# Patient Record
Sex: Female | Born: 1950
Health system: Southern US, Community
[De-identification: ages and names within clinical notes are randomized; demographics above are authoritative.]

## PROBLEM LIST (undated history)

## (undated) HISTORY — PX: BLADDER SURGERY: SHX569

---

## 2000-11-10 ENCOUNTER — Other Ambulatory Visit: Admission: RE | Admit: 2000-11-10 | Discharge: 2000-11-10 | Payer: Self-pay | Admitting: Obstetrics and Gynecology

## 2001-11-14 ENCOUNTER — Other Ambulatory Visit: Admission: RE | Admit: 2001-11-14 | Discharge: 2001-11-14 | Payer: Self-pay | Admitting: Obstetrics and Gynecology

## 2002-12-11 ENCOUNTER — Other Ambulatory Visit: Admission: RE | Admit: 2002-12-11 | Discharge: 2002-12-11 | Payer: Self-pay | Admitting: Obstetrics and Gynecology

## 2003-06-22 ENCOUNTER — Encounter (INDEPENDENT_AMBULATORY_CARE_PROVIDER_SITE_OTHER): Payer: Self-pay | Admitting: *Deleted

## 2003-06-22 ENCOUNTER — Ambulatory Visit (HOSPITAL_COMMUNITY): Admission: RE | Admit: 2003-06-22 | Discharge: 2003-06-22 | Payer: Self-pay | Admitting: Obstetrics and Gynecology

## 2003-12-17 ENCOUNTER — Other Ambulatory Visit: Admission: RE | Admit: 2003-12-17 | Discharge: 2003-12-17 | Payer: Self-pay | Admitting: Obstetrics and Gynecology

## 2005-01-15 ENCOUNTER — Other Ambulatory Visit: Admission: RE | Admit: 2005-01-15 | Discharge: 2005-01-15 | Payer: Self-pay | Admitting: Obstetrics and Gynecology

## 2008-03-06 ENCOUNTER — Encounter: Admission: RE | Admit: 2008-03-06 | Discharge: 2008-03-06 | Payer: Self-pay | Admitting: Obstetrics and Gynecology

## 2008-10-07 ENCOUNTER — Emergency Department (HOSPITAL_COMMUNITY): Admission: EM | Admit: 2008-10-07 | Discharge: 2008-10-07 | Payer: Self-pay | Admitting: Emergency Medicine

## 2008-10-09 ENCOUNTER — Emergency Department (HOSPITAL_COMMUNITY): Admission: EM | Admit: 2008-10-09 | Discharge: 2008-10-09 | Payer: Self-pay | Admitting: Emergency Medicine

## 2009-02-06 ENCOUNTER — Encounter: Admission: RE | Admit: 2009-02-06 | Discharge: 2009-02-06 | Payer: Self-pay | Admitting: Obstetrics and Gynecology

## 2009-03-07 ENCOUNTER — Encounter (INDEPENDENT_AMBULATORY_CARE_PROVIDER_SITE_OTHER): Payer: Self-pay | Admitting: Obstetrics and Gynecology

## 2009-03-08 ENCOUNTER — Inpatient Hospital Stay (HOSPITAL_COMMUNITY): Admission: RE | Admit: 2009-03-08 | Discharge: 2009-03-09 | Payer: Self-pay | Admitting: Obstetrics and Gynecology

## 2010-11-30 LAB — CBC
HCT: 35.9 % — ABNORMAL LOW (ref 36.0–46.0)
HCT: 43.7 % (ref 36.0–46.0)
Hemoglobin: 12.3 g/dL (ref 12.0–15.0)
Hemoglobin: 15.4 g/dL — ABNORMAL HIGH (ref 12.0–15.0)
MCHC: 35.2 g/dL (ref 30.0–36.0)
MCV: 88.9 fL (ref 78.0–100.0)
RBC: 3.97 MIL/uL (ref 3.87–5.11)
RDW: 12.6 % (ref 11.5–15.5)

## 2010-11-30 LAB — BASIC METABOLIC PANEL
CO2: 28 mEq/L (ref 19–32)
Glucose, Bld: 93 mg/dL (ref 70–99)
Potassium: 4.1 mEq/L (ref 3.5–5.1)
Sodium: 140 mEq/L (ref 135–145)

## 2010-12-09 LAB — POCT I-STAT, CHEM 8
Calcium, Ion: 1.05 mmol/L — ABNORMAL LOW (ref 1.12–1.32)
Glucose, Bld: 132 mg/dL — ABNORMAL HIGH (ref 70–99)
HCT: 44 % (ref 36.0–46.0)
Hemoglobin: 15 g/dL (ref 12.0–15.0)
TCO2: 21 mmol/L (ref 0–100)

## 2011-01-06 NOTE — H&P (Signed)
NAMEMARGURETTE, BRENER               ACCOUNT NO.:  [id number]   MEDICAL RECORD NO.:  [id number]          PATIENT TYPE:  AMB   LOCATION:  SDC                           FACILITY:  [facility]   PHYSICIAN:  Guy Sandifer. Henderson Cloud, M.D. DATE OF BIRTH:  February 04, 1951   DATE OF ADMISSION:  03/07/2009  DATE OF DISCHARGE:                              HISTORY & PHYSICAL   CHIEF COMPLAINT:  Symptomatic pelvic relaxation.   HISTORY OF PRESENT ILLNESS:  This patient is a 60 year old married white  female G1, P1, who is postmenopausal.  She complains of lower abdominal  and pelvic pressure.  She has a ballooning-type protrusion at times  through the vaginal introitus.  She occasionally has to reduce this.   PHYSICAL EXAMINATION:  She has a pelvic relaxation.  Also, urodynamic  studies done with a pessary in place are consistent with genuine stress  urinary continence.  After discussion of options, she is being admitted  for laparoscopically-assisted vaginal hysterectomy with bilateral  salpingo-oophorectomy, anterior-posterior colporrhaphy with probable  grafts, and midurethral sling.  Potential risks and complications have  been discussed preoperatively.   PAST MEDICAL HISTORY:  Arthritis.   PAST SURGICAL HISTORY:  Hysteroscopy and D and C.   MEDICATIONS:  Prempro daily and Mobic p.r.n.   ALLERGIES:  No known drug allergies.   SOCIAL HISTORY:  The patient denies tobacco, alcohol, or drug abuse.   FAMILY HISTORY:  Negative.   REVIEW OF SYSTEMS:  NEURO:  Denies headache.  CARDIO:  Denies chest  pain.  PULMONARY:  Denies shortness of breath.   PHYSICAL EXAMINATION:  VITAL SIGNS:  Height 5 feet 6-1/2 inches, weight  155 pounds, and blood pressure 122/66.  HEENT:  Without thyromegaly.  LUNGS:  Clear to auscultation.  HEART:  Regular rate and rhythm.  BACK:  No CVA tenderness.  ABDOMEN:  Soft and nontender without masses.  PELVIC:  Both vagina and cervix without lesion.  There is upper vaginal  detachment  anteriorly, which presents at the vaginal introitus.  The  cervix is right behind that with a very mobile, upper size, normal  uterus.  Adnexa are nontender without masses.  RECTAL:  There is adequate rectal sphincter tone.  Rectovaginal septum  is quite thin.  EXTREMITIES:  Grossly within normal limits.  NEUROLOGICAL:  Grossly within normal limits.   ASSESSMENT:  1. Symptomatic pelvic relaxation.  2. Genuine stress urinary continence.   PLAN:  Laparoscopically-assisted vaginal hysterectomy, bilateral  salpingo-oophorectomy, anterior-posterior colporrhaphy, and midurethral  sling.      Guy Sandifer Henderson Cloud, M.D.  Electronically Signed     JET/MEDQ  D:  03/06/2009  T:  03/07/2009  Job:  [id number]

## 2011-01-06 NOTE — Op Note (Signed)
[name], [name]               ACCOUNT NO.:  [id number]   MEDICAL RECORD NO.:  [id number]          PATIENT TYPE:  OIB   LOCATION:  9315                          FACILITY:  [facility]   PHYSICIAN:  Guy Sandifer. Henderson Cloud, M.D. DATE OF BIRTH:  [birth year]   DATE OF PROCEDURE:  03/07/2009  DATE OF DISCHARGE:                               OPERATIVE REPORT   PREOPERATIVE DIAGNOSES:  1. Pelvic relaxation.  2. Stress urinary incontinence.   POSTOPERATIVE DIAGNOSES:  1. Pelvic relaxation.  2. Stress urinary incontinence.   PROCEDURE:  Laparoscopically-assisted vaginal hysterectomy with  bilateral salpingo-oophorectomy, anterior colporrhaphy, vaginal  colpopexy, insertion of mesh, posterior colporrhaphy, and cystoscopy.   SURGEON:  Guy Sandifer. Henderson Cloud, MD   ASSISTANT:  Duke Salvia. Marcelle Overlie, MD   ANESTHESIA:  General with endotracheal intubation.   SPECIMENS:  Uterus, bilateral tubes, and ovaries to Pathology.   ESTIMATED BLOOD LOSS:  100 mL.   INDICATIONS AND CONSENT:  This patient is a 60 year old, married, white  female, G1, P1 with symptomatic pelvic relaxation and stress  incontinence.  Details are dictated in the history and physical.  The  above procedures were discussed with the patient preoperatively.  Potential risks and complications have been discussed preoperatively  including, but not limited to infection, organ damage, bleeding  requiring transfusion of blood products with HIV and hepatitis  acquisition, DVT, PE, pneumonia, fistula formation, laparotomy, pelvic  pain, postoperative dyspareunia, erosion, delayed healing, recurrence of  pelvic relaxation to assess the vaginal dilators, success and failure  rates of the sling, postoperative catheterization, self-catheterization,  return to the operating room, irritative voiding symptoms, and vulvar  pain.  All questions were answered and consent is signed on the chart.   FINDINGS:  Upper abdomen is grossly normal.  Uterus is normal  in size  and contour.  Tubes are normal in appearance.  Anterior and posterior  cul-de-sacs normal.   PROCEDURE:  The patient was taken to the operating room where she is  identified, placed in dorsal supine position and general anesthesia was  induced via endotracheal intubation.  She was then placed in the dorsal  lithotomy position, where she is prepped abdominally and vaginally.  Bladder straight catheterized.  Hulka tenaculum was placed in the uterus  as a manipulator and she is draped in the sterile fashion.  Time-out was  then undertaken.  The infraumbilical and suprapubic areas were then  injected with 1.5% plain Marcaine.  Small infraumbilical incision was  made and a disposable Veress needle was placed on the first attempt  without difficulty.  A normal syringe and drop test are noted.  A 2 L of  gas were then insufflated under low pressure with good tympany in the  right upper quadrant.  Veress needle was removed and a 10-11 Xcel  bladeless with disposable trocar sleeve was placed using direct  visualization with the diagnostic laparoscope.  The operative  laparoscope was then used.  Small suprapubic incision is made in a 5-mm  Xcel bladeless disposable trocar sleeve was placed under direct  visualization without difficulty.  The above findings are noted.  Then  using the EnSeal bipolar cautery cutting device, the right  infundibulopelvic ligament is taken down, carried across the  mesosalpinx, across the round ligament down to the level of the  vesicouterine peritoneum.  Similar procedure is carried out on the left  as well.  This is clear of the course of the ureters bilaterally.  The  vesicouterine peritoneum was taken down cephalad laterally.  Good  hemostasis was maintained.  Suprapubic trocar sleeve was removed.  Instruments are removed.  Attention is turned to the vagina.  Posterior  cul-de-sacs entered sharply and the cervix was circumscribed with  unipolar cautery.   Mucosa is advanced sharply and bluntly.  Then using  the handheld gyrus bipolar cautery instrument, progressive bites were  taken of the bladder pillars, cardinal ligaments, uterine vessels  bilaterally.  Fundus was delivered posteriorly and the specimens  delivered as the pedicles were taken down.  The uterosacral ligaments  were then plicated the vaginal cuff bilaterally with 0 Monocryl sutures.  They are then plicated in the midline with a third suture.  Cuff was  closed with figure-of-eights of the same suture.  A transverse vaginal  incision is made.  The anterior vaginal mucosa approximately at the  halfway mark.  The base of the bladder is then dissected sharply and  bluntly down to the level of the cuff and then bilaterally to the  ischial spines.  Then using the Capio needle driver, the pole graft is  placed by passing the arms through the sacrospinous ligaments  bilaterally at least 1 fingerbreadth medial to the spine.  The arms were  then advanced.  A 0 Monocryl suture is used to anchor the superior  midline edge of the graft to just above the vaginal cuff.  After being  properly seated, the sheath was removed intact from the arms bilaterally  and excess arms were trimmed back.  The distal midline portion of the  graft was anchored in place as well as the 2 distal corners with the  same suture.  The graft is lying flat.  There is good support  anteriorly.  The vaginal mucosa was then closed in a running locking  fashion with 2-0 Monocryl suture.  Foley catheter was placed.  The  bladder was drained and the Foley was left in place.  The suburethral  vaginal mucosa was then infiltrated with 1.5% Xylocaine with 1:200,000  epinephrine. It should also be noted, the anterior mucosa was  infiltrated with the same thing prior to the anterior colporrhaphy.  A  small midline incision is made below the course of the urethra.  This  was opened with the sterile needle scissors to the level  of the  vesicouterine or the urogenital diaphragm bilaterally.  The Solyx  polypropylene mesh mid urethral sling was then placed first on the  patient left being careful to keep the center of the sling which had  been previously marked in the midline.  It is then passed on the patient  right as well.  After the proper tension is adjusted, the sling is  released.  The sling is lying flush to the suburethral tissues with no  kinks.  It is just in contact with the tissues.  The vaginal mucosa is  then closed with a running locking 2-0 Monocryl suture.  Foley catheter  is removed and 73 cystoscope is used.  Inspection of the bladder at 360  degree fashion reveals no evidence of perforation or foreign body.  Good  puff of  urine is noted from the ureters bilaterally.  Cystoscope was  removed.  Foley catheter is replaced.  The bladder was drained and the  Foley was left in place.  Posterior colporrhaphy was then done beginning  with the diamond shaped wedge of tissue removed from the perineal body.  The mucosa is infiltrated with the same Xylocaine epinephrine solution.  The mucosa is dissected from the underlying rectum in the midline  approximately 2/3rd way up the posterior mucosa.  It has been dissected  bilaterally, sharply, and bluntly.  The rectovaginal fascia was  reapproximated in the midline with interrupted 0 Monocryl sutures.  Small amount of vaginal mucosa is then trimmed from the edges and the  mucosa is then reapproximated in running locking fashion with 2-0  Monocryl suture down to the level of the perineal body.  Perineal body  is dissected down, reapproximated with interrupted 0 Monocryl pops and 2-  0 sutures continued on down a standard episiotomy type fashion.  A 1-  inch gauze with estrogen cream was then placed in the vagina.  Attention  was turned to the abdomen.  Pneumoperitoneum is reintroduced and the  suprapubic trocar sleeve was replaced under direct visualization.   Copious irrigation was carried out.  Small amount of bleeding at the  mucosal edges is controlled with bipolar cautery.  Inspection under  reduced pneumoperitoneum reveals continued hemostasis.  A 25 mL of 1.5%  Marcaine  was then instilled into the peritoneal cavity.  Pneumoperitoneum was  reduced and all instruments are removed.  Both skin incisions were  closed with interrupted 2-0 Vicryl suture.  Dermabond was placed.  All  counts were correct.  The patient is awakened, taken to recovery room in  stable condition.      Guy Sandifer Henderson Cloud, M.D.  Electronically Signed     JET/MEDQ  D:  03/07/2009  T:  03/08/2009  Job:  [id number]

## 2011-01-06 NOTE — Discharge Summary (Signed)
[name], [name]               ACCOUNT NO.:  [id number]   MEDICAL RECORD NO.:  [id number]          PATIENT TYPE:  OIB   LOCATION:  9315                          FACILITY:  [facility]   PHYSICIAN:  Guy Sandifer. Henderson Cloud, M.D. DATE OF BIRTH:  [birth year]   DATE OF ADMISSION:  03/07/2009  DATE OF DISCHARGE:  03/09/2009                               DISCHARGE SUMMARY   ADMITTING DIAGNOSES:  1. Pelvic relaxation.  2. Stress urinary incontinence.   DISCHARGE DIAGNOSES:  1. Pelvic relaxation.  2. Stress urinary incontinence.   PROCEDURE:  On March 07, 2009, laparoscopically assisted vaginal  hysterectomy and bilateral salpingo-oophorectomy, anterior colporrhaphy,  vaginal colpopexy, insertion of mesh, posterior colporrhaphy, and  cystoscopy.   REASON FOR ADMISSION:  This patient is a 60 year old married white  female G1, P1 with symptomatic pelvic relaxation and stress  incontinence.  Details are dictated in the history and physical.  She  was admitted for surgical management.   HOSPITAL COURSE:  She was admitted to the hospital and undergoes the  above procedure.  Estimated blood loss was 100 mL.  On the evening of  surgery, she has good pain relief, urine output, and stable vital signs.  On the first postoperative day, the vaginal pack and Foley catheter are  out.  She is passing flatus but has not yet voided.  Vital signs are  stable, she is afebrile.  Hemoglobin is 12.3.  On the day of discharge,  she is tolerating regular diet, voiding, passing flatus, and ambulating.  Vital signs are stable, she remains afebrile.  Pathology is pending.   CONDITION ON DISCHARGE:  Good.   DIET:  Regular as tolerated.   ACTIVITY:  No lifting, no operation of automobiles, and no vaginal  entry.  She is to call our office for problems including but not limited  to temperature of 101 degrees, persistent nausea, vomiting, increasing  pain, or heavy bleeding.   MEDICATIONS:  1. Percocet 5/325 mg #40, 1-2  p.o. q.6 h. p.r.n.  2. Ibuprofen 600 mg q.6 h. p.r.n.  3. Colace daily.   FOLLOWUP:  Followup is in the office in 2 weeks.      Guy Sandifer Henderson Cloud, M.D.  Electronically Signed     JET/MEDQ  D:  03/09/2009  T:  03/09/2009  Job:  [id number]

## 2011-01-09 NOTE — Op Note (Signed)
NAME:  [name], [name]                         ACCOUNT NO.:  [id number]   MEDICAL RECORD NO.:  [id number]                   PATIENT TYPE:  AMB   LOCATION:  SDC                                  FACILITY:  [facility]   PHYSICIAN:  Guy Sandifer. Arleta Creek, M.D.           DATE OF BIRTH:  August 10, 1951   DATE OF PROCEDURE:  06/22/2003  DATE OF DISCHARGE:                                 OPERATIVE REPORT   PREOPERATIVE DIAGNOSIS:  Postmenopausal bleeding.   POSTOPERATIVE DIAGNOSIS:  Postmenopausal bleeding.   PROCEDURE:  Hysteroscopy, dilatation and curettage.   SURGEON:  Guy Sandifer. Henderson Cloud, M.D.   ANESTHESIA:  1. MAC.  2. 1% Xylocaine paracervical block.   ESTIMATED BLOOD LOSS:  Less than 50 mL.   INTAKE AND OUTPUT OF SORBITOL DISTENDING MEDIUM:  70 mL deficit.   SPECIMENS:  Endometrial curettings.   INDICATIONS AND CONSENT:  This patient is a 60 year old married white  female, G1, P1, who has had postmenopausal bleeding.  Details dictated in  the history and physical.  Hysteroscopy, resectoscope, and dilatation and  curettage has been discussed with the patient.  The potential risks and  complications were discussed preoperatively, including but not limited to  infection, uterine perforation, bowel, bladder, or ureteral damage, bleeding  requiring transfusion of blood products with possible transfusion, HIV and  hepatitis acquisition, DVT, PE, pneumonia, laparoscopy, laparotomy, and  hysterectomy.  All questions answered, consent was signed on the chart.   FINDINGS:  No abnormal structures were noted within the endometrial cavity.   DESCRIPTION OF PROCEDURE:  The patient was taken to the operating room,  placed in the dorsal supine position, where sedation is given.  She is then  placed in the dorsal lithotomy position, where she is prepped, the bladder  is straight-catheterized, and draped in a sterile fashion.  Bivalve speculum  was placed in the vagina and the anterior cervical lip is  injected with 1%  Xylocaine and grasped with a single-tooth tenaculum.  Paracervical block is  then placed at the 2, 4, 5, 7, 8, and 10 o'clock positions with  approximately 20 mL total of 1% Xylocaine plain.  The cervix is gently  progressively dilated to a 31 Pratt dilator.  The diagnostic hysteroscope is  placed in the endocervical canal and advanced under direct visualization  using sorbitol distending medium.  The above findings are noted.  Hysteroscope is withdrawn and sharp curettage is carried out.  The  hysteroscope is then reintroduced and again  advanced under direct visualization.  The cavity is totally clean.  The  instruments are removed, good hemostasis is noted.  All counts correct.  The  patient is awakened and taken to the recovery room in stable condition.  The  patient did received preoperative antibiotics.  Guy Sandifer Arleta Creek, M.D.    JET/MEDQ  D:  06/22/2003  T:  06/22/2003  Job:  [id number]

## 2011-01-09 NOTE — H&P (Signed)
   NAME:  [name], [name]                           ACCOUNT NO.:  [id number]   MEDICAL RECORD NO.:  [id number]                   PATIENT TYPE:   LOCATION:                                       FACILITY:   PHYSICIAN:  Guy Sandifer. Arleta Creek, M.D.           DATE OF BIRTH:   DATE OF ADMISSION:  06/22/2003  DATE OF DISCHARGE:                                HISTORY & PHYSICAL   CHIEF COMPLAINT:  Postmenopausal bleeding.   HISTORY OF PRESENT ILLNESS:  This patient is a 60 year old white female,  gravida 1, para 1, who had her last normal menstrual period in 2002.  She  has been on Prempro since May of this year. She has had at least six days of  bleeding.  Ultrasound in my office revealed a uterus measuring 9.37 x 3.80 x  4.86 cm.  Sonohysterogram was consistent with a 4 mm possibly polypoid  process.  Options were discussed.  Hysteroscopy with resectoscope and D&C  were discussed.  Potential risks and complications have been discussed  preoperatively.  The patient discontinued Prempro one week preoperatively.   PAST MEDICAL HISTORY:  Negative.   PAST SURGICAL HISTORY:  Tubal ligation.   PAST OBSTETRICAL HISTORY:  Vaginal delivery x1.   SOCIAL HISTORY:  The patient denies tobacco, alcohol, or drug abuse.   MEDICATIONS:  Vitamins p.r.n.   ALLERGIES:  No known drug allergies.   FAMILY HISTORY:  Multiple gestations maternal grandfather.   REVIEW OF SYSTEMS:  NEUROLOGY: The patient denies headache.  PULMONARY:  Denies shortness of breath.  CARDIAC: Denies chest pain.  GASTROINTESTINAL:  Denies recent changes in bowel habits.  MUSCULOSKELETAL: Denies joint aches.   PHYSICAL EXAMINATION:  VITAL SIGNS: Height 5 feet 7-1/2 inches, weight 157  pounds, blood pressure 130/74.  HEENT:  Without thyromegaly.  LUNGS:  Clear to auscultation.  HEART:  Regular rate and rhythm.  BACK: Without CVA tenderness.  BREASTS: Without masses, retractions, or discharge.  ABDOMEN: Soft and nontender without  masses.  PELVIC: Vulva, vagina, and cervix without lesion.  Uterus is anteverted,  normal size, mobile, and nontender.  Adnexa nontender without masses.  NEUROLOGY:  EXTREMITIES: Grossly within normal limits.    ASSESSMENT:  Postmenopausal bleeding.   PLAN:  Hysteroscopy with resectoscope, dilatation and curettage.                                               Guy Sandifer Arleta Creek, M.D.    JET/MEDQ  D:  06/14/2003  T:  06/14/2003  Job:  [id number]

## 2011-01-17 ENCOUNTER — Emergency Department (HOSPITAL_BASED_OUTPATIENT_CLINIC_OR_DEPARTMENT_OTHER)
Admission: EM | Admit: 2011-01-17 | Discharge: 2011-01-17 | Disposition: A | Discharge status: Home / Self Care | Payer: BC Managed Care – PPO | Attending: Emergency Medicine | Admitting: Emergency Medicine

## 2011-01-17 ENCOUNTER — Emergency Department (INDEPENDENT_AMBULATORY_CARE_PROVIDER_SITE_OTHER): Payer: BC Managed Care – PPO

## 2011-01-17 DIAGNOSIS — W208XXA Other cause of strike by thrown, projected or falling object, initial encounter: Secondary | ICD-10-CM | POA: Insufficient documentation

## 2011-01-17 DIAGNOSIS — M79609 Pain in unspecified limb: Secondary | ICD-10-CM

## 2011-01-17 DIAGNOSIS — S61409A Unspecified open wound of unspecified hand, initial encounter: Secondary | ICD-10-CM | POA: Insufficient documentation

## 2011-01-17 DIAGNOSIS — S61209A Unspecified open wound of unspecified finger without damage to nail, initial encounter: Secondary | ICD-10-CM

## 2011-01-17 DIAGNOSIS — G8929 Other chronic pain: Secondary | ICD-10-CM | POA: Insufficient documentation

## 2014-04-20 ENCOUNTER — Institutional Professional Consult (permissible substitution): Payer: BC Managed Care – PPO | Admitting: Pulmonary Disease

## 2014-09-05 ENCOUNTER — Other Ambulatory Visit (HOSPITAL_BASED_OUTPATIENT_CLINIC_OR_DEPARTMENT_OTHER): Payer: Self-pay | Admitting: Family Medicine

## 2014-09-05 ENCOUNTER — Ambulatory Visit (HOSPITAL_BASED_OUTPATIENT_CLINIC_OR_DEPARTMENT_OTHER)
Admission: RE | Admit: 2014-09-05 | Discharge: 2014-09-05 | Disposition: A | Discharge status: Home / Self Care | Payer: BLUE CROSS/BLUE SHIELD | Source: Ambulatory Visit | Attending: Family Medicine | Admitting: Family Medicine

## 2014-09-05 DIAGNOSIS — R160 Hepatomegaly, not elsewhere classified: Secondary | ICD-10-CM | POA: Diagnosis not present

## 2014-09-05 DIAGNOSIS — R1011 Right upper quadrant pain: Secondary | ICD-10-CM

## 2014-09-05 LAB — CBC AND DIFFERENTIAL
HCT: 42 % (ref 36–46)
Hemoglobin: 13.7 g/dL (ref 12.0–16.0)
Platelets: 236 10*3/uL (ref 150–399)
WBC: 16.7 10^3/mL

## 2014-09-05 LAB — HEPATIC FUNCTION PANEL
ALT: 39 U/L — AB (ref 7–35)
AST: 31 U/L (ref 13–35)
Bilirubin, Total: 1.7 mg/dL

## 2014-09-05 LAB — BASIC METABOLIC PANEL
BUN: 10 mg/dL (ref 4–21)
Glucose: 112 mg/dL
POTASSIUM: 3.8 mmol/L (ref 3.4–5.3)
Sodium: 137 mmol/L (ref 137–147)

## 2014-09-10 ENCOUNTER — Other Ambulatory Visit (HOSPITAL_BASED_OUTPATIENT_CLINIC_OR_DEPARTMENT_OTHER): Payer: Self-pay | Admitting: Family Medicine

## 2014-09-10 DIAGNOSIS — R932 Abnormal findings on diagnostic imaging of liver and biliary tract: Secondary | ICD-10-CM

## 2014-09-10 DIAGNOSIS — M5489 Other dorsalgia: Secondary | ICD-10-CM

## 2014-09-10 DIAGNOSIS — R1011 Right upper quadrant pain: Secondary | ICD-10-CM

## 2014-09-10 DIAGNOSIS — M25511 Pain in right shoulder: Secondary | ICD-10-CM

## 2014-09-12 ENCOUNTER — Encounter (HOSPITAL_COMMUNITY): Payer: Self-pay | Admitting: *Deleted

## 2014-09-12 ENCOUNTER — Ambulatory Visit (HOSPITAL_BASED_OUTPATIENT_CLINIC_OR_DEPARTMENT_OTHER)
Admission: RE | Admit: 2014-09-12 | Discharge: 2014-09-12 | Disposition: A | Discharge status: Home / Self Care | Payer: BLUE CROSS/BLUE SHIELD | Source: Ambulatory Visit | Attending: Family Medicine | Admitting: Family Medicine

## 2014-09-12 ENCOUNTER — Inpatient Hospital Stay (HOSPITAL_COMMUNITY)
Admission: EM | Admit: 2014-09-12 | Discharge: 2014-09-17 | DRG: 871 | Disposition: A | Discharge status: Home Health | Payer: BLUE CROSS/BLUE SHIELD | Attending: Internal Medicine | Admitting: Internal Medicine

## 2014-09-12 DIAGNOSIS — A419 Sepsis, unspecified organism: Principal | ICD-10-CM | POA: Diagnosis present

## 2014-09-12 DIAGNOSIS — K750 Abscess of liver: Secondary | ICD-10-CM | POA: Diagnosis present

## 2014-09-12 DIAGNOSIS — M25511 Pain in right shoulder: Secondary | ICD-10-CM

## 2014-09-12 DIAGNOSIS — Z79899 Other long term (current) drug therapy: Secondary | ICD-10-CM | POA: Diagnosis not present

## 2014-09-12 DIAGNOSIS — K7689 Other specified diseases of liver: Secondary | ICD-10-CM | POA: Diagnosis not present

## 2014-09-12 DIAGNOSIS — M5489 Other dorsalgia: Secondary | ICD-10-CM

## 2014-09-12 DIAGNOSIS — R1011 Right upper quadrant pain: Secondary | ICD-10-CM

## 2014-09-12 DIAGNOSIS — L0291 Cutaneous abscess, unspecified: Secondary | ICD-10-CM

## 2014-09-12 DIAGNOSIS — D72829 Elevated white blood cell count, unspecified: Secondary | ICD-10-CM | POA: Diagnosis not present

## 2014-09-12 DIAGNOSIS — R109 Unspecified abdominal pain: Secondary | ICD-10-CM

## 2014-09-12 DIAGNOSIS — K769 Liver disease, unspecified: Secondary | ICD-10-CM

## 2014-09-12 DIAGNOSIS — K5710 Diverticulosis of small intestine without perforation or abscess without bleeding: Secondary | ICD-10-CM | POA: Insufficient documentation

## 2014-09-12 DIAGNOSIS — R7989 Other specified abnormal findings of blood chemistry: Secondary | ICD-10-CM | POA: Diagnosis present

## 2014-09-12 DIAGNOSIS — IMO0002 Reserved for concepts with insufficient information to code with codable children: Secondary | ICD-10-CM

## 2014-09-12 DIAGNOSIS — R509 Fever, unspecified: Secondary | ICD-10-CM | POA: Diagnosis not present

## 2014-09-12 DIAGNOSIS — R932 Abnormal findings on diagnostic imaging of liver and biliary tract: Secondary | ICD-10-CM | POA: Insufficient documentation

## 2014-09-12 LAB — CBC WITH DIFFERENTIAL/PLATELET
Basophils Absolute: 0 10*3/uL (ref 0.0–0.1)
Basophils Relative: 0 % (ref 0–1)
Eosinophils Absolute: 0.1 10*3/uL (ref 0.0–0.7)
Eosinophils Relative: 0 % (ref 0–5)
HCT: 35.5 % — ABNORMAL LOW (ref 36.0–46.0)
Hemoglobin: 12.3 g/dL (ref 12.0–15.0)
Lymphocytes Relative: 10 % — ABNORMAL LOW (ref 12–46)
Lymphs Abs: 2.1 10*3/uL (ref 0.7–4.0)
MCH: 30.2 pg (ref 26.0–34.0)
MCHC: 34.6 g/dL (ref 30.0–36.0)
MCV: 87.2 fL (ref 78.0–100.0)
Monocytes Absolute: 1.7 10*3/uL — ABNORMAL HIGH (ref 0.1–1.0)
Monocytes Relative: 8 % (ref 3–12)
Neutro Abs: 16.7 10*3/uL — ABNORMAL HIGH (ref 1.7–7.7)
Neutrophils Relative %: 82 % — ABNORMAL HIGH (ref 43–77)
Platelets: 395 10*3/uL (ref 150–400)
RBC: 4.07 MIL/uL (ref 3.87–5.11)
RDW: 12.5 % (ref 11.5–15.5)
WBC: 20.5 10*3/uL — ABNORMAL HIGH (ref 4.0–10.5)

## 2014-09-12 LAB — COMPREHENSIVE METABOLIC PANEL
ALT: 49 U/L — ABNORMAL HIGH (ref 0–35)
AST: 57 U/L — ABNORMAL HIGH (ref 0–37)
Albumin: 2.7 g/dL — ABNORMAL LOW (ref 3.5–5.2)
Alkaline Phosphatase: 185 U/L — ABNORMAL HIGH (ref 39–117)
Anion gap: 7 (ref 5–15)
BUN: 5 mg/dL — ABNORMAL LOW (ref 6–23)
CO2: 30 mmol/L (ref 19–32)
Calcium: 8.2 mg/dL — ABNORMAL LOW (ref 8.4–10.5)
Chloride: 94 mEq/L — ABNORMAL LOW (ref 96–112)
Creatinine, Ser: 0.78 mg/dL (ref 0.50–1.10)
GFR calc Af Amer: >90 mL/min (ref >90)
GFR calc non Af Amer: 87 mL/min — ABNORMAL LOW (ref >90)
Glucose, Bld: 138 mg/dL — ABNORMAL HIGH (ref 70–99)
Potassium: 3.5 mmol/L (ref 3.5–5.1)
Sodium: 131 mmol/L — ABNORMAL LOW (ref 135–145)
Total Bilirubin: 1 mg/dL (ref 0.3–1.2)
Total Protein: 6.8 g/dL (ref 6.0–8.3)

## 2014-09-12 LAB — I-STAT CG4 LACTIC ACID, ED: Lactic Acid, Venous: 0.61 mmol/L (ref 0.5–2.2)

## 2014-09-12 MED ORDER — GADOBENATE DIMEGLUMINE 529 MG/ML IV SOLN: 15.0000 mL | Freq: Once | INTRAVENOUS | Status: AC | PRN

## 2014-09-12 MED ORDER — SODIUM CHLORIDE 0.9 % IV BOLUS (SEPSIS)
1000.0000 mL | Freq: Once | INTRAVENOUS | Status: AC
  Administered 2014-09-12: 1000 mL via INTRAVENOUS

## 2014-09-12 MED ORDER — MORPHINE SULFATE 4 MG/ML IJ SOLN
4.0000 mg | Freq: Once | INTRAMUSCULAR | Status: AC
  Administered 2014-09-12: 4 mg via INTRAVENOUS
  Filled 2014-09-12: qty 1

## 2014-09-12 MED ORDER — PIPERACILLIN-TAZOBACTAM 3.375 G IVPB 30 MIN
3.3750 g | Freq: Once | INTRAVENOUS | Status: AC
  Administered 2014-09-12: 3.375 g via INTRAVENOUS
  Filled 2014-09-12: qty 50

## 2014-09-12 MED ORDER — ONDANSETRON HCL 4 MG/2ML IJ SOLN
4.0000 mg | Freq: Once | INTRAMUSCULAR | Status: AC
  Administered 2014-09-12: 4 mg via INTRAVENOUS
  Filled 2014-09-12: qty 2

## 2014-09-12 MED ORDER — IBUPROFEN 400 MG PO TABS
600.0000 mg | ORAL_TABLET | Freq: Once | ORAL | Status: AC
  Administered 2014-09-12: 600 mg via ORAL
  Filled 2014-09-12 (×2): qty 1

## 2014-09-12 MED ORDER — SODIUM CHLORIDE 0.9 % IV SOLN
Freq: Once | INTRAVENOUS | Status: AC
  Administered 2014-09-12: via INTRAVENOUS

## 2014-09-12 NOTE — ED Provider Notes (Signed)
Medical screening examination/treatment/procedure(s) were conducted as a shared visit with non-physician practitioner(s) and myself.  I personally evaluated the patient during the encounter.   EKG Interpretation None      Pt is a 65 y.o. F with no significant past medical history presents emergency department with 2 weeks of right upper quadrant pain that radiates into her right shoulder. On 09/05/14 she had an ultrasound that showed multiple hypo echoic lesions. She had an MRI of her abdomen today which showed multiple hepatic lesions concerning for malignancy versus abscesses. She is febrile in the emergency department and has a leukocytosis with left shift. Fever infection. We'll give Zosyn. Will admit.  [place], DO 09/12/14 [id number]

## 2014-09-12 NOTE — ED Provider Notes (Signed)
CSN: [id number]     Arrival date & time 09/12/14  2152 History   First MD Initiated Contact with Patient 09/12/14 2210     Chief Complaint  Patient presents with  . Abdominal Pain   [name] is a 64 y.o. female who presents the emergency department complaining of abdominal pain for the past 2 weeks. Patient had a MRI of her abdomen done earlier today as an outpatient which indicated multiple peripherally enhancing hepatic lesions. Patient's primary care provider provided her with tramadol for pain however she reports this has not been working. Patient reports taking tramadol 50 mg at 845 tonight with minimal relief. Patient currently reports she is having right upper quadrant abdominal pain at 6 out of 10. She reports also having right shoulder pain for the past 2 weeks associated with her abdominal pain. Patient also reports decreased appetite. The patient's previous abdominal surgical history includes a total hysterectomy. The patient denies alcohol use. Patient's last bowel movement was yesterday and was normal. The patient denies fevers, chills, nausea, vomiting, constipation, diarrhea, travel outside the country, dysuria, hematuria, or hematochezia.   (Consider location/radiation/quality/duration/timing/severity/associated sxs/prior Treatment) HPI  History reviewed. No pertinent past medical history. History reviewed. No pertinent past surgical history. No family history on file. History  Substance Use Topics  . Smoking status: Never Smoker   . Smokeless tobacco: Not on file  . Alcohol Use: No   OB History    No data available     Review of Systems  Constitutional: Positive for appetite change. Negative for fever and chills.  HENT: Negative for congestion and sore throat.   Eyes: Negative for visual disturbance.  Respiratory: Negative for cough, shortness of breath and wheezing.   Cardiovascular: Negative for chest pain and palpitations.  Gastrointestinal: Positive for  abdominal pain. Negative for nausea, vomiting, diarrhea and blood in stool.  Genitourinary: Negative for dysuria, frequency, hematuria, vaginal bleeding and difficulty urinating.  Musculoskeletal: Negative for back pain and neck pain.       Right shoulder pain  Skin: Negative for rash.  Neurological: Negative for syncope and headaches.      Allergies  Review of patient's allergies indicates no known allergies.  Home Medications   Prior to Admission medications   Medication Sig Start Date End Date Taking? Authorizing Provider  traMADol (ULTRAM) 50 MG tablet Take 50 mg by mouth every 6 (six) hours as needed for moderate pain.   Yes Historical Provider, MD   BP 112/63 mmHg  Pulse 93  Temp(Src) 101.9 F (38.8 C) (Rectal)  Resp 18  Ht 5\' 7"  (1.702 m)  Wt 158 lb (71.668 kg)  BMI 24.74 kg/m2  SpO2 97% Physical Exam  Constitutional: She appears well-developed and well-nourished. No distress.  Nontoxic appearing.  HENT:  Head: Normocephalic and atraumatic.  Mouth/Throat: Oropharynx is clear and moist.  Eyes: Conjunctivae are normal. Pupils are equal, round, and reactive to light. Right eye exhibits no discharge. Left eye exhibits no discharge. No scleral icterus.  Neck: Neck supple.  Cardiovascular: Normal rate, regular rhythm, normal heart sounds and intact distal pulses.  Exam reveals no gallop and no friction rub.   No murmur heard. HR 96  Pulmonary/Chest: Effort normal and breath sounds normal. No respiratory distress. She has no wheezes. She has no rales.  Abdominal: Soft. Bowel sounds are normal. She exhibits no distension and no mass. There is tenderness. There is no rebound and no guarding.  Patient has right upper quadrant tenderness to palpation. Abdomen  is soft. Bowel sounds are present. No McBurney's point tenderness. Negative psoas and obturator sign.  Musculoskeletal: She exhibits no edema.  Right shoulder is nontender to palpation. There is no deformity, ecchymosis,   or edema noted to her right shoulder  Lymphadenopathy:    She has no cervical adenopathy.  Neurological: She is alert. Coordination normal.  Skin: Skin is warm and dry. No rash noted. She is not diaphoretic. No erythema. No pallor.  Psychiatric: She has a normal mood and affect. Her behavior is normal.  Nursing note and vitals reviewed.   ED Course  Procedures (including critical care time) Labs Review Labs Reviewed  COMPREHENSIVE METABOLIC PANEL - Abnormal; Notable for the following:    Sodium 131 (*)    Chloride 94 (*)    Glucose, Bld 138 (*)    BUN 5 (*)    Calcium 8.2 (*)    Albumin 2.7 (*)    AST 57 (*)    ALT 49 (*)    Alkaline Phosphatase 185 (*)    GFR calc non Af Amer 87 (*)    All other components within normal limits  CBC WITH DIFFERENTIAL - Abnormal; Notable for the following:    WBC 20.5 (*)    HCT 35.5 (*)    Neutrophils Relative % 82 (*)    Neutro Abs 16.7 (*)    Lymphocytes Relative 10 (*)    Monocytes Absolute 1.7 (*)    All other components within normal limits  CULTURE, BLOOD (ROUTINE X 2)  CULTURE, BLOOD (ROUTINE X 2)  URINE CULTURE  URINALYSIS, ROUTINE W REFLEX MICROSCOPIC  ECHINOCOCCUS ANTIBODY, IGG  CBC  BASIC METABOLIC PANEL  I-STAT CG4 LACTIC ACID, ED    Imaging Review Mr Abdomen W Wo Contrast  09/12/2014   CLINICAL DATA:  Initial encounter for right upper abdominal and shoulder pain. Ultrasound demonstrating hypoechoic liver lesions. No primary malignancy history submitted.  EXAM: MRI ABDOMEN WITHOUT AND WITH CONTRAST  TECHNIQUE: Multiplanar multisequence MR imaging of the abdomen was performed both before and after the administration of intravenous contrast.  CONTRAST:  14 cc MultiHance  COMPARISON:  09/05/2014 abdominal ultrasound.  FINDINGS: Lower chest: Normal heart size without pericardial or pleural effusion.  Hepatobiliary: No evidence of cirrhosis. Multiple heterogeneous T2 hyperintense, peripherally enhancing lesions are identified  throughout both lobes of the liver. Peripheral enhancing septae identified within the majority of these lesions. Index lesions include the high right hepatic lobe 3.9 x 3.9 cm lesion on image 7 of series 3. Anterior segment left liver lobe subcapsular 4.5 x 3.0 cm lesion on image 9 of series 3. Posterior segment right lobe 3.3 x 2.1 cm lesion on image 19 of series 3.  Minimal intrahepatic biliary ductal dilatation. Normal appearance of the gallbladder. The common duct measures 9 mm, minimally dilated for patient age. No obstructive stone identified. There is a periampullary duodenal diverticulum which may cause a component of common duct dilatation.  Pancreas: Tiny cystic foci within the pancreatic body dorsally. Example image 18 of series 14. 2 foci of measure on the order of 3 mm and likely represent foci of side branch ductectasia. No acute pancreatitis or main branch duct dilatation. No dominant pancreatic mass.  Spleen: Normal  Adrenals/Urinary Tract: Normal adrenal glands. Tiny bilateral renal cysts.  Stomach/Bowel: Normal stomach and abdominal bowel loops.  Vascular/Lymphatic: Normal caliber of the aorta and branch vessels. No retroperitoneal or retrocrural adenopathy.  Other: No ascites.  Musculoskeletal: No acute osseous abnormality.  IMPRESSION: 1. Multiple  peripherally enhancing hepatic lesions. Favor metastatic disease from unknown primary (possibilities include colon, breast, thyroid). Multifocal abscesses (pyogenic or even echinococcal) could have a similar appearance. Correlate with infectious symptoms and history of primary malignancy. Consider tissue sampling. 2. No primary malignancy identified within the abdomen. 3. Minimal biliary ductal prominence. No obstructive mass or stone. There is a periampullary duodenal diverticulum which could be the cause. 4. Minimal side branch duct ectasia within the pancreatic body. Of doubtful clinical significance.   Electronically Signed   By: Abigail Miyamoto M.D.    On: 09/12/2014 12:18     EKG Interpretation None      Filed Vitals:   09/12/14 2156 09/12/14 2305 09/12/14 2315  BP: 151/79  112/63  Pulse: 121  93  Temp: 99.8 F (37.7 C) 101.9 F (38.8 C)   TempSrc: Oral Rectal   Resp: 18    Height: 5\' 7"  (1.702 m)    Weight: 158 lb (71.668 kg)    SpO2: 97%  97%     MDM   Meds given in ED:  Medications  0.9 %  sodium chloride infusion (not administered)  sodium chloride 0.9 % injection 3 mL (not administered)  morphine 2 MG/ML injection 2-4 mg (not administered)  ondansetron (ZOFRAN) injection 4 mg (not administered)  morphine 4 MG/ML injection 4 mg (4 mg Intravenous Given 09/12/14 2248)  ondansetron (ZOFRAN) injection 4 mg (4 mg Intravenous Given 09/12/14 2248)  sodium chloride 0.9 % bolus 1,000 mL (0 mLs Intravenous Stopped 09/12/14 2343)  ibuprofen (ADVIL,MOTRIN) tablet 600 mg (600 mg Oral Given 09/12/14 2323)  piperacillin-tazobactam (ZOSYN) IVPB 3.375 g (3.375 g Intravenous New Bag/Given 09/12/14 2343)  0.9 %  sodium chloride infusion ( Intravenous Stopped 09/13/14 0032)    New Prescriptions   No medications on file     Final diagnoses:  Liver abscess   This is a 64 year old female who presents to emergency department with 2 weeks of right upper quadrant abdominal pain and right shoulder pain. Patient had an MRI of her abdomen today that showed multiple liver lesions concerning for malignancy or abscess. Patient has a rectal temperature 101.9. The patient's white count is 20.5. She also has mildly elevated LFTs. Concern for liver abscess. Patient given Zosyn and ibuprofen 600 mg in the ED. Consulted for admission. Dr. Alcario Drought accepted the patient for admission. Patient is in agreement with plan for admission.  This patient was discussed with and evaluated by Dr. Leonides Schanz who agrees with assessment and plan.     Hanley Hays, PA-C 09/13/14 [phone]

## 2014-09-12 NOTE — ED Notes (Signed)
Dansie PA made aware of pt temp

## 2014-09-12 NOTE — ED Notes (Signed)
The pt has had abd pain since last Thursday.  She has had an ultra sound and earlier today she had a mri.  She was sent by her doctor because she was in pain and she cannot get any relief.  Crying in triage

## 2014-09-13 ENCOUNTER — Encounter (HOSPITAL_COMMUNITY): Payer: Self-pay | Admitting: General Practice

## 2014-09-13 ENCOUNTER — Inpatient Hospital Stay (HOSPITAL_COMMUNITY): Payer: BLUE CROSS/BLUE SHIELD

## 2014-09-13 DIAGNOSIS — IMO0002 Reserved for concepts with insufficient information to code with codable children: Secondary | ICD-10-CM | POA: Diagnosis present

## 2014-09-13 DIAGNOSIS — K769 Liver disease, unspecified: Secondary | ICD-10-CM | POA: Diagnosis present

## 2014-09-13 DIAGNOSIS — K7689 Other specified diseases of liver: Secondary | ICD-10-CM

## 2014-09-13 DIAGNOSIS — K750 Abscess of liver: Secondary | ICD-10-CM | POA: Diagnosis present

## 2014-09-13 DIAGNOSIS — A419 Sepsis, unspecified organism: Principal | ICD-10-CM

## 2014-09-13 LAB — URINALYSIS, ROUTINE W REFLEX MICROSCOPIC
Bilirubin Urine: NEGATIVE
GLUCOSE, UA: NEGATIVE mg/dL
HGB URINE DIPSTICK: NEGATIVE
KETONES UR: NEGATIVE mg/dL
LEUKOCYTES UA: NEGATIVE
Nitrite: NEGATIVE
PROTEIN: NEGATIVE mg/dL
Specific Gravity, Urine: 1.007 (ref 1.005–1.030)
Urobilinogen, UA: 1 mg/dL (ref 0.0–1.0)
pH: 6.5 (ref 5.0–8.0)

## 2014-09-13 LAB — CBC
HCT: 35.3 % — ABNORMAL LOW (ref 36.0–46.0)
Hemoglobin: 11.9 g/dL — ABNORMAL LOW (ref 12.0–15.0)
MCH: 29.3 pg (ref 26.0–34.0)
MCHC: 33.7 g/dL (ref 30.0–36.0)
MCV: 86.9 fL (ref 78.0–100.0)
PLATELETS: 333 10*3/uL (ref 150–400)
RBC: 4.06 MIL/uL (ref 3.87–5.11)
RDW: 12.6 % (ref 11.5–15.5)
WBC: 16.7 10*3/uL — ABNORMAL HIGH (ref 4.0–10.5)

## 2014-09-13 LAB — BASIC METABOLIC PANEL
Anion gap: 9 (ref 5–15)
BUN: <5 mg/dL — ABNORMAL LOW (ref 6–23)
CO2: 26 mmol/L (ref 19–32)
Calcium: 8 mg/dL — ABNORMAL LOW (ref 8.4–10.5)
Chloride: 104 mEq/L (ref 96–112)
Creatinine, Ser: 0.7 mg/dL (ref 0.50–1.10)
GFR calc non Af Amer: 90 mL/min — ABNORMAL LOW (ref >90)
GLUCOSE: 119 mg/dL — AB (ref 70–99)
Potassium: 3.5 mmol/L (ref 3.5–5.1)
Sodium: 139 mmol/L (ref 135–145)

## 2014-09-13 LAB — PROTIME-INR
INR: 1.48 (ref 0.00–1.49)
Prothrombin Time: 18.1 seconds — ABNORMAL HIGH (ref 11.6–15.2)

## 2014-09-13 LAB — APTT: APTT: 32 s (ref 24–37)

## 2014-09-13 MED ORDER — SODIUM CHLORIDE 0.9 % IJ SOLN
3.0000 mL | Freq: Two times a day (BID) | INTRAMUSCULAR | Status: DC
  Administered 2014-09-14: 3 mL via INTRAVENOUS

## 2014-09-13 MED ORDER — ACETAMINOPHEN 325 MG PO TABS: 650.0000 mg | ORAL_TABLET | Freq: Four times a day (QID) | ORAL | Status: DC | PRN

## 2014-09-13 MED ORDER — LIDOCAINE HCL 1 % IJ SOLN
INTRAMUSCULAR | Status: AC
  Filled 2014-09-13: qty 20

## 2014-09-13 MED ORDER — SODIUM CHLORIDE 0.9 % IV SOLN
INTRAVENOUS | Status: DC
  Administered 2014-09-13 (×3): via INTRAVENOUS
  Administered 2014-09-13: 1000 mL via INTRAVENOUS
  Administered 2014-09-14 – 2014-09-17 (×6): via INTRAVENOUS

## 2014-09-13 MED ORDER — PIPERACILLIN-TAZOBACTAM 3.375 G IVPB
3.3750 g | Freq: Three times a day (TID) | INTRAVENOUS | Status: DC
  Administered 2014-09-13 – 2014-09-17 (×13): 3.375 g via INTRAVENOUS
  Filled 2014-09-13 (×15): qty 50

## 2014-09-13 MED ORDER — FENTANYL CITRATE 0.05 MG/ML IJ SOLN
INTRAMUSCULAR | Status: AC | PRN
  Administered 2014-09-13: 50 ug via INTRAVENOUS

## 2014-09-13 MED ORDER — MIDAZOLAM HCL 2 MG/2ML IJ SOLN
INTRAMUSCULAR | Status: AC
  Filled 2014-09-13: qty 4

## 2014-09-13 MED ORDER — MIDAZOLAM HCL 2 MG/2ML IJ SOLN
INTRAMUSCULAR | Status: AC | PRN
  Administered 2014-09-13: 1 mg via INTRAVENOUS

## 2014-09-13 MED ORDER — MORPHINE SULFATE 2 MG/ML IJ SOLN
2.0000 mg | INTRAMUSCULAR | Status: DC | PRN
  Administered 2014-09-13: 2 mg via INTRAVENOUS
  Administered 2014-09-13: 4 mg via INTRAVENOUS
  Administered 2014-09-13: 2 mg via INTRAVENOUS
  Administered 2014-09-13: 4 mg via INTRAVENOUS
  Administered 2014-09-13: 2 mg via INTRAVENOUS
  Administered 2014-09-14 – 2014-09-15 (×8): 4 mg via INTRAVENOUS
  Administered 2014-09-15: 2 mg via INTRAVENOUS
  Administered 2014-09-15 – 2014-09-17 (×8): 4 mg via INTRAVENOUS
  Administered 2014-09-17: 2 mg via INTRAVENOUS
  Administered 2014-09-17 (×2): 4 mg via INTRAVENOUS
  Filled 2014-09-13: qty 2
  Filled 2014-09-13: qty 1
  Filled 2014-09-13 (×12): qty 2
  Filled 2014-09-13: qty 1
  Filled 2014-09-13: qty 2
  Filled 2014-09-13: qty 1
  Filled 2014-09-13 (×4): qty 2
  Filled 2014-09-13: qty 1
  Filled 2014-09-13 (×2): qty 2

## 2014-09-13 MED ORDER — ONDANSETRON HCL 4 MG/2ML IJ SOLN: 4.0000 mg | Freq: Four times a day (QID) | INTRAMUSCULAR | Status: DC | PRN

## 2014-09-13 MED ORDER — FENTANYL CITRATE 0.05 MG/ML IJ SOLN
INTRAMUSCULAR | Status: AC
  Filled 2014-09-13: qty 4

## 2014-09-13 NOTE — Progress Notes (Addendum)
64yo female c/o abdominal pain x1wk, MRI shows multiple hepatic lesions concerning for malignancy vs abscesses, to begin IV ABX.  Will start Zosyn 3.375g IV Q8H for CrCl ~70 ml/min and monitor CBC and Cx.  Wynona Neat, PharmD, BCPS 09/13/2014 12:14 AM   Pharmacy will sign off and adjust as needed. Thank you. Anette Guarneri, PharmD [phone]

## 2014-09-13 NOTE — Progress Notes (Signed)
Nutrition Brief Note  Patient identified on the Malnutrition Screening Tool (MST) Report  Wt Readings from Last 15 Encounters:  09/13/14 158 lb 15.2 oz (72.1 kg)    Body mass index is 24.89 kg/(m^2). Patient meets criteria for normal body weight based on current BMI.   Current diet order is regular. Pt reports no recent weight loss. She says that she will be able to eat on her own and denied the need for nutritional supplements at this time.  Labs and medications reviewed.   No nutrition interventions warranted at this time. If nutrition issues arise, please consult RD.   Laurette Schimke MS, RD, LDN

## 2014-09-13 NOTE — Consult Note (Signed)
Chief Complaint: Chief Complaint  Patient presents with  . Abdominal Pain    Referring Physician(s): THR  History of Present Illness: [name] is a 64 y.o. female   Admitted from ED with RUQ pain Abnormal [place] abd and MRI revealing liver lesions/abscesses Pt with high wbc and fever Request for aspiration for evaluation per [facility] Dr Barbie Banner has reviewed imaging and approves procedure I have sen and examined pt   History reviewed. No pertinent past medical history.  History reviewed. No pertinent past surgical history.  Allergies: Review of patient's allergies indicates no known allergies.  Medications: Prior to Admission medications   Medication Sig Start Date End Date Taking? Authorizing Provider  traMADol (ULTRAM) 50 MG tablet Take 50 mg by mouth every 6 (six) hours as needed for moderate pain.   Yes Historical Provider, MD    History reviewed. No pertinent family history.  History   Social History  . Marital Status: Married    Spouse Name: N/A    Number of Children: N/A  . Years of Education: N/A   Social History Main Topics  . Smoking status: Never Smoker   . Smokeless tobacco: None  . Alcohol Use: No  . Drug Use: None  . Sexual Activity: None   Other Topics Concern  . None   Social History Narrative     Review of Systems: A 12 point ROS discussed and pertinent positives are indicated in the HPI above.  All other systems are negative.  Review of Systems  Constitutional: Positive for fever, chills, activity change, appetite change and fatigue.  Respiratory: Negative for cough and shortness of breath.   Gastrointestinal: Positive for abdominal pain.  Genitourinary: Negative for difficulty urinating.  Neurological: Positive for weakness.  Psychiatric/Behavioral: Negative for behavioral problems and confusion.     Vital Signs: BP 110/59 mmHg  Pulse 83  Temp(Src) 97.3 F (36.3 C) (Oral)  Resp 18  Ht 5\' 7"  (1.702 m)  Wt 72.1 kg (158 lb 15.2  oz)  BMI 24.89 kg/m2  SpO2 98%  Physical Exam  Constitutional: She is oriented to person, place, and time.  Cardiovascular: Normal rate and regular rhythm.   Pulmonary/Chest: Effort normal and breath sounds normal.  Abdominal: Soft. Bowel sounds are normal. There is tenderness.  Musculoskeletal: Normal range of motion.  Neurological: She is alert and oriented to person, place, and time.  Skin: Skin is warm and dry.  Psychiatric: She has a normal mood and affect. Her behavior is normal. Judgment and thought content normal.  Nursing note and vitals reviewed.   Imaging: Mr Abdomen W Wo Contrast  09/12/2014   CLINICAL DATA:  Initial encounter for right upper abdominal and shoulder pain. Ultrasound demonstrating hypoechoic liver lesions. No primary malignancy history submitted.  EXAM: MRI ABDOMEN WITHOUT AND WITH CONTRAST  TECHNIQUE: Multiplanar multisequence MR imaging of the abdomen was performed both before and after the administration of intravenous contrast.  CONTRAST:  14 cc MultiHance  COMPARISON:  09/05/2014 abdominal ultrasound.  FINDINGS: Lower chest: Normal heart size without pericardial or pleural effusion.  Hepatobiliary: No evidence of cirrhosis. Multiple heterogeneous T2 hyperintense, peripherally enhancing lesions are identified throughout both lobes of the liver. Peripheral enhancing septae identified within the majority of these lesions. Index lesions include the high right hepatic lobe 3.9 x 3.9 cm lesion on image 7 of series 3. Anterior segment left liver lobe subcapsular 4.5 x 3.0 cm lesion on image 9 of series 3. Posterior segment right lobe 3.3 x 2.1  cm lesion on image 19 of series 3.  Minimal intrahepatic biliary ductal dilatation. Normal appearance of the gallbladder. The common duct measures 9 mm, minimally dilated for patient age. No obstructive stone identified. There is a periampullary duodenal diverticulum which may cause a component of common duct dilatation.  Pancreas:  Tiny cystic foci within the pancreatic body dorsally. Example image 18 of series 14. 2 foci of measure on the order of 3 mm and likely represent foci of side branch ductectasia. No acute pancreatitis or main branch duct dilatation. No dominant pancreatic mass.  Spleen: Normal  Adrenals/Urinary Tract: Normal adrenal glands. Tiny bilateral renal cysts.  Stomach/Bowel: Normal stomach and abdominal bowel loops.  Vascular/Lymphatic: Normal caliber of the aorta and branch vessels. No retroperitoneal or retrocrural adenopathy.  Other: No ascites.  Musculoskeletal: No acute osseous abnormality.  IMPRESSION: 1. Multiple peripherally enhancing hepatic lesions. Favor metastatic disease from unknown primary (possibilities include colon, breast, thyroid). Multifocal abscesses (pyogenic or even echinococcal) could have a similar appearance. Correlate with infectious symptoms and history of primary malignancy. Consider tissue sampling. 2. No primary malignancy identified within the abdomen. 3. Minimal biliary ductal prominence. No obstructive mass or stone. There is a periampullary duodenal diverticulum which could be the cause. 4. Minimal side branch duct ectasia within the pancreatic body. Of doubtful clinical significance.   Electronically Signed   By: Abigail Miyamoto M.D.   On: 09/12/2014 12:18   US Abdomen Complete  09/05/2014   CLINICAL DATA:  Right upper quadrant pain with extension to the right scapula for the past 2 days  EXAM: ULTRASOUND ABDOMEN COMPLETE  COMPARISON:  None.  FINDINGS: Gallbladder: No gallstones or wall thickening visualized. No sonographic Murphy sign noted.  Common bile duct: Diameter: 6 mm  Liver: The hepatic echotexture is normal. There are numerous hypoechoic nodules demonstrated with the largest lying along the anterior surface of the right lobe measuring 2.7 x 2.2 x 3 cm. There are internal echoes but there is no increased vascularity. There is no intrahepatic ductal dilation.  IVC: Bowel gas  limits evaluation of the inferior vena cava.  Pancreas: Bowel gas limits evaluation of the pancreas.  Spleen: Size and appearance within normal limits.  Right Kidney: Length: 10.8 cm. Echogenicity within normal limits. No mass or hydronephrosis visualized.  Left Kidney: Length: 10.7 cm. Echogenicity within normal limits. No mass or hydronephrosis visualized.  Abdominal aorta: No aneurysm visualized.  Other findings: No ascites is demonstrated.  IMPRESSION: 1. There are abnormal hypoechoic masses within the liver of uncertain etiology. These could reflect metastatic or less likely primary malignancy. They do not reflect simple cysts. Hepatic protocol MRI is recommended. 2. There is no acute abnormality of the gallbladder, common bile duct, or visualized portions of the pancreas. There is no acute abnormality of the kidneys or spleen.   Electronically Signed   By: David  [place]   On: 09/05/2014 15:08    Labs:  CBC:  Recent Labs  09/12/14 2227 09/13/14 0655  WBC 20.5* 16.7*  HGB 12.3 11.9*  HCT 35.5* 35.3*  PLT 395 333    COAGS:  Recent Labs  09/13/14 0753  INR 1.48  APTT 32    BMP:  Recent Labs  09/12/14 2227 09/13/14 0655  NA 131* 139  K 3.5 3.5  CL 94* 104  CO2 30 26  GLUCOSE 138* 119*  BUN 5* <5*  CALCIUM 8.2* 8.0*  CREATININE 0.78 0.70  GFRNONAA 87* 90*  GFRAA >90 >90    LIVER FUNCTION  TESTS:  Recent Labs  09/12/14 2227  BILITOT 1.0  AST 57*  ALT 49*  ALKPHOS 185*  PROT 6.8  ALBUMIN 2.7*    TUMOR MARKERS: No results for input(s): AFPTM, CEA, CA199, CHROMGRNA in the last 8760 hours.  Assessment and Plan:  Liver abscesses Now for aspiration for evaluation Pt is aware of procedure benefits and risks and agreeable to proceed Consent signed andin chart  Thank you for this interesting consult.  I greatly enjoyed meeting [name] and look forward to participating in their care.    I spent a total of 40 minutes face to face in clinical  consultation, greater than 50% of which was counseling/coordinating care for liver abscess aspiration  Signed: Shakura Cowing A 09/13/2014, 9:28 AM

## 2014-09-13 NOTE — H&P (Signed)
[facility] History and Physical  [name] OIN:[id number] DOB: 05-23-1951 DOA: 09/12/2014  Referring physician: EDP PCP: Allena Katz, MD   Chief Complaint: RUQ abdominal pain   HPI: [name] is a 64 y.o. female previously healthy, who presents to the ED with 1 week history of RUQ abdominal pain.  Pain is 6/10 in intensity, ultrasound was done on 1/13 demonstrating hypoechoic masses of the liver.  Follow up liver MRI was done today as an outpatient which shows multiple peripherally enhancing hepatic lesions which represent either metastatic disease from an unknown primary (none was seen in the abdomen on that same MRI) vs multifocal abscesses which could have a similar appearance.  In the ED patient is running a fever of 101.9, is tachycardic to 120s, and has a WBC of 20k.  Review of Systems: Systems reviewed.  As above, otherwise negative  History reviewed. No pertinent past medical history. History reviewed. No pertinent past surgical history. Social History:  reports that she has never smoked. She does not have any smokeless tobacco history on file. She reports that she does not drink alcohol. Her drug history is not on file.  No Known Allergies  No family history on file.   Prior to Admission medications   Medication Sig Start Date End Date Taking? Authorizing Provider  traMADol (ULTRAM) 50 MG tablet Take 50 mg by mouth every 6 (six) hours as needed for moderate pain.   Yes Historical Provider, MD   Physical Exam: Filed Vitals:   09/12/14 2315  BP: 112/63  Pulse: 93  Temp:   Resp:     BP 112/63 mmHg  Pulse 93  Temp(Src) 101.9 F (38.8 C) (Rectal)  Resp 18  Ht 5\' 7"  (1.702 m)  Wt 71.668 kg (158 lb)  BMI 24.74 kg/m2  SpO2 97%  General Appearance:    Alert, oriented, no distress, appears stated age  Head:    Normocephalic, atraumatic  Eyes:    PERRL, EOMI, sclera non-icteric        Nose:   Nares without drainage or epistaxis. Mucosa,  turbinates normal  Throat:   Moist mucous membranes. Oropharynx without erythema or exudate.  Neck:   Supple. No carotid bruits.  No thyromegaly.  No lymphadenopathy.   Back:     No CVA tenderness, no spinal tenderness  Lungs:     Clear to auscultation bilaterally, without wheezes, rhonchi or rales  Chest wall:    No tenderness to palpitation  Heart:    Regular rate and rhythm without murmurs, gallops, rubs  Abdomen:     Soft, non-tender, nondistended, normal bowel sounds, no organomegaly  Genitalia:    deferred  Rectal:    deferred  Extremities:   No clubbing, cyanosis or edema.  Pulses:   2+ and symmetric all extremities  Skin:   Skin color, texture, turgor normal, no rashes or lesions  Lymph nodes:   Cervical, supraclavicular, and axillary nodes normal  Neurologic:   CNII-XII intact. Normal strength, sensation and reflexes      throughout    Labs on Admission:  Basic Metabolic Panel:  Recent Labs Lab 09/12/14 2227  NA 131*  K 3.5  CL 94*  CO2 30  GLUCOSE 138*  BUN 5*  CREATININE 0.78  CALCIUM 8.2*   Liver Function Tests:  Recent Labs Lab 09/12/14 2227  AST 57*  ALT 49*  ALKPHOS 185*  BILITOT 1.0  PROT 6.8  ALBUMIN 2.7*   No results for input(s): LIPASE, AMYLASE  in the last 168 hours. No results for input(s): AMMONIA in the last 168 hours. CBC:  Recent Labs Lab 09/12/14 2227  WBC 20.5*  NEUTROABS 16.7*  HGB 12.3  HCT 35.5*  MCV 87.2  PLT 395   Cardiac Enzymes: No results for input(s): CKTOTAL, CKMB, CKMBINDEX, TROPONINI in the last 168 hours.  BNP (last 3 results) No results for input(s): PROBNP in the last 8760 hours. CBG: No results for input(s): GLUCAP in the last 168 hours.  Radiological Exams on Admission: Mr Abdomen W Wo Contrast  09/12/2014   CLINICAL DATA:  Initial encounter for right upper abdominal and shoulder pain. Ultrasound demonstrating hypoechoic liver lesions. No primary malignancy history submitted.  EXAM: MRI ABDOMEN WITHOUT  AND WITH CONTRAST  TECHNIQUE: Multiplanar multisequence MR imaging of the abdomen was performed both before and after the administration of intravenous contrast.  CONTRAST:  14 cc MultiHance  COMPARISON:  09/05/2014 abdominal ultrasound.  FINDINGS: Lower chest: Normal heart size without pericardial or pleural effusion.  Hepatobiliary: No evidence of cirrhosis. Multiple heterogeneous T2 hyperintense, peripherally enhancing lesions are identified throughout both lobes of the liver. Peripheral enhancing septae identified within the majority of these lesions. Index lesions include the high right hepatic lobe 3.9 x 3.9 cm lesion on image 7 of series 3. Anterior segment left liver lobe subcapsular 4.5 x 3.0 cm lesion on image 9 of series 3. Posterior segment right lobe 3.3 x 2.1 cm lesion on image 19 of series 3.  Minimal intrahepatic biliary ductal dilatation. Normal appearance of the gallbladder. The common duct measures 9 mm, minimally dilated for patient age. No obstructive stone identified. There is a periampullary duodenal diverticulum which may cause a component of common duct dilatation.  Pancreas: Tiny cystic foci within the pancreatic body dorsally. Example image 18 of series 14. 2 foci of measure on the order of 3 mm and likely represent foci of side branch ductectasia. No acute pancreatitis or main branch duct dilatation. No dominant pancreatic mass.  Spleen: Normal  Adrenals/Urinary Tract: Normal adrenal glands. Tiny bilateral renal cysts.  Stomach/Bowel: Normal stomach and abdominal bowel loops.  Vascular/Lymphatic: Normal caliber of the aorta and branch vessels. No retroperitoneal or retrocrural adenopathy.  Other: No ascites.  Musculoskeletal: No acute osseous abnormality.  IMPRESSION: 1. Multiple peripherally enhancing hepatic lesions. Favor metastatic disease from unknown primary (possibilities include colon, breast, thyroid). Multifocal abscesses (pyogenic or even echinococcal) could have a similar  appearance. Correlate with infectious symptoms and history of primary malignancy. Consider tissue sampling. 2. No primary malignancy identified within the abdomen. 3. Minimal biliary ductal prominence. No obstructive mass or stone. There is a periampullary duodenal diverticulum which could be the cause. 4. Minimal side branch duct ectasia within the pancreatic body. Of doubtful clinical significance.   Electronically Signed   By: Abigail Miyamoto M.D.   On: 09/12/2014 12:18    EKG: Independently reviewed.  Assessment/Plan Principal Problem:   Liver abscess Active Problems:   Abdominal abscess   Lesion of liver   Sepsis   1. Multifocal liver abscesses vs metastatic disease to liver - these peripherally enhancing lesions are clinically acting in a manner consistent with abscesses.  Patient DOES have infectious symptoms (3/4 SIRS criteria with fever of 101.9, tachycardia of 121, and WBC of 20.5k) on evaluation today in the ED.  Echinococcus seems less likely as patient has no recent history of travel outside the country and does not consume undercooked meat. 1. BCx pending 2. Echinococcus IgG is pending 3. Zosyn  per pharm consult 4. Agree with radiologist that tissue sampling seems reasonable at this point given that the 2 most likely diagnoses are multifocal pyogenic liver abscesses vs metastatic malignancy to liver of unknown primary. 1. Spoke with radiologist on call, have put consult to IR in [facility], for evaluation and biopsy / drainage if they agree that it is appropriate. 2. Patient is NPO and on only SCDs for DVT ppx for possible biopsy / drainage 5. IVF: NS at 125 cc/hr 6. Tylenol PRN fever 7. Morphine PRN pain    Code Status: Full Code  Family Communication: Husband at bedside Disposition Plan: Admit to inpatient   Time spent: 70 min  GARDNER, JARED M. [facility] Pager [phone]  If 7AM-7PM, please contact the day team taking care of the patient [email or web address] Password  [facility] 09/13/2014, 12:25 AM

## 2014-09-13 NOTE — Procedures (Signed)
Liver abscess aspiration 15 cc pus No comp

## 2014-09-13 NOTE — Progress Notes (Signed)
Muse fired for patient when patient arrived to the floor- pt already on abx.

## 2014-09-13 NOTE — Progress Notes (Signed)
Utilization review completed. Jamica Woodyard, RN, BSN. 

## 2014-09-13 NOTE — ED Notes (Signed)
Admitting MD at bedside.

## 2014-09-13 NOTE — Sedation Documentation (Signed)
Bandaid to RU abdomen. Pt tolerated aspiration well

## 2014-09-13 NOTE — Progress Notes (Signed)
[facility] PROGRESS NOTE  [name] UEA:[id number] DOB: 03-Jul-1951 DOA: 09/12/2014 PCP: Nancy Katz, MD  Assessment/Plan:  SIRS -Presenting with fever, tachycardia, leukocytosis, source unclear but liver mass on CT questionable for abscess -Placed on IV Zosyn -blood/fungus cxs pending  Liver Mass - MRI liver- multiple enhancing hepatic lesions, representing metastatic disease or abscess -Radiologist on board- Aspiration 1/21 -Placed on IV Zosyn  -Echinococcus IgG pending -Blood cxs pening   DVT Prophylaxis SCDs Code Status: Full Family Communication: No family at bedside Disposition Plan: Inpatient   Consultants:  Radiology  Procedures:  Aspiration 09/13/14  Antibiotics:  Zosyn 1/21 >>  HPI [name] a 64 yo female, previously healthy, that presents to the ED with 2 week history of RUQ abdominal pain. Pain is associated with right shoulder pain, and reports decreased appetite.  She denies any fever, chills, N/V/D.  In ED, pt febrile, tachycardic, and with leukocytosis. Abd [place] with hypoechoic mass of liver. MRI liver with multiple enhancing hepatic lesions, representing metastatic disease or abscess. Patient is admitted for assessment.   Subjective -C/o of abdominal pain.  Denies any other complaints  Objective: Filed Vitals:   09/13/14 0548  BP: 110/59  Pulse:   Temp: 97.3 F (36.3 C)  Resp: 18    Intake/Output Summary (Last 24 hours) at 09/13/14 1331 Last data filed at 09/13/14 1017  Gross per 24 hour  Intake      0 ml  Output   1350 ml  Net  -1350 ml   Filed Weights   09/12/14 2156 09/13/14 0134  Weight: 71.668 kg (158 lb) 72.1 kg (158 lb 15.2 oz)    Exam:  Gen: Alert Caucasian female in NAD.  HEENT: Normocephalic, atraumatic.  Pupils symmertrical.  Moist mucosa.   Chest: clear to auscultate bilaterally, no ronchi or rales  Cardiac: Regular rate and rhythm, S1-S2, no rubs murmurs or gallops  Abdomen: soft, tenderness in  RUQ, non distended, +bowel sounds. No guarding or rigidity  Extremities: Symmetrical in appearance without cyanosis or edema  Neurological: Alert awake oriented to time place and person.    Data Reviewed: Basic Metabolic Panel:  Recent Labs Lab 09/12/14 2227 09/13/14 0655  NA 131* 139  K 3.5 3.5  CL 94* 104  CO2 30 26  GLUCOSE 138* 119*  BUN 5* <5*  CREATININE 0.78 0.70  CALCIUM 8.2* 8.0*   Liver Function Tests:  Recent Labs Lab 09/12/14 2227  AST 57*  ALT 49*  ALKPHOS 185*  BILITOT 1.0  PROT 6.8  ALBUMIN 2.7*   No results for input(s): LIPASE, AMYLASE in the last 168 hours. No results for input(s): AMMONIA in the last 168 hours. CBC:  Recent Labs Lab 09/12/14 2227 09/13/14 0655  WBC 20.5* 16.7*  NEUTROABS 16.7*  --   HGB 12.3 11.9*  HCT 35.5* 35.3*  MCV 87.2 86.9  PLT 395 333   Cardiac Enzymes: No results for input(s): CKTOTAL, CKMB, CKMBINDEX, TROPONINI in the last 168 hours. BNP (last 3 results) No results for input(s): PROBNP in the last 8760 hours. CBG: No results for input(s): GLUCAP in the last 168 hours.  No results found for this or any previous visit (from the past 240 hour(s)).   Studies: Mr Abdomen W Wo Contrast  09/12/2014   CLINICAL DATA:  Initial encounter for right upper abdominal and shoulder pain. Ultrasound demonstrating hypoechoic liver lesions. No primary malignancy history submitted.  EXAM: MRI ABDOMEN WITHOUT AND WITH CONTRAST  TECHNIQUE: Multiplanar multisequence MR imaging of  the abdomen was performed both before and after the administration of intravenous contrast.  CONTRAST:  14 cc MultiHance  COMPARISON:  09/05/2014 abdominal ultrasound.  FINDINGS: Lower chest: Normal heart size without pericardial or pleural effusion.  Hepatobiliary: No evidence of cirrhosis. Multiple heterogeneous T2 hyperintense, peripherally enhancing lesions are identified throughout both lobes of the liver. Peripheral enhancing septae identified within the  majority of these lesions. Index lesions include the high right hepatic lobe 3.9 x 3.9 cm lesion on image 7 of series 3. Anterior segment left liver lobe subcapsular 4.5 x 3.0 cm lesion on image 9 of series 3. Posterior segment right lobe 3.3 x 2.1 cm lesion on image 19 of series 3.  Minimal intrahepatic biliary ductal dilatation. Normal appearance of the gallbladder. The common duct measures 9 mm, minimally dilated for patient age. No obstructive stone identified. There is a periampullary duodenal diverticulum which may cause a component of common duct dilatation.  Pancreas: Tiny cystic foci within the pancreatic body dorsally. Example image 18 of series 14. 2 foci of measure on the order of 3 mm and likely represent foci of side branch ductectasia. No acute pancreatitis or main branch duct dilatation. No dominant pancreatic mass.  Spleen: Normal  Adrenals/Urinary Tract: Normal adrenal glands. Tiny bilateral renal cysts.  Stomach/Bowel: Normal stomach and abdominal bowel loops.  Vascular/Lymphatic: Normal caliber of the aorta and branch vessels. No retroperitoneal or retrocrural adenopathy.  Other: No ascites.  Musculoskeletal: No acute osseous abnormality.  IMPRESSION: 1. Multiple peripherally enhancing hepatic lesions. Favor metastatic disease from unknown primary (possibilities include colon, breast, thyroid). Multifocal abscesses (pyogenic or even echinococcal) could have a similar appearance. Correlate with infectious symptoms and history of primary malignancy. Consider tissue sampling. 2. No primary malignancy identified within the abdomen. 3. Minimal biliary ductal prominence. No obstructive mass or stone. There is a periampullary duodenal diverticulum which could be the cause. 4. Minimal side branch duct ectasia within the pancreatic body. Of doubtful clinical significance.   Electronically Signed   By: [name] M.D.   On: 09/12/2014 12:18    Scheduled Meds: . fentaNYL      . lidocaine      .  midazolam      . piperacillin-tazobactam (ZOSYN)  IV  3.375 g Intravenous Q8H  . sodium chloride  3 mL Intravenous Q12H   Continuous Infusions: . sodium chloride 125 mL/hr at 09/13/14 1194    Principal Problem:   Liver abscess Active Problems:   Abdominal abscess   Lesion of liver   Sepsis    Time spent: [place], [place] [facility]  [facility] Pager [phone]. If 7PM-7AM, please contact night-coverage at [email or web address], password [facility] 09/13/2014, 1:31 PM  LOS: 1 day

## 2014-09-13 NOTE — Progress Notes (Signed)
  Pt admitted to the unit. Pt is stable, alert and oriented per baseline. Oriented to room, staff, and call bell. Educated to call for any assistance. Bed in lowest position, call bell within reach- will continue to monitor. 

## 2014-09-14 DIAGNOSIS — M25511 Pain in right shoulder: Secondary | ICD-10-CM

## 2014-09-14 DIAGNOSIS — D72829 Elevated white blood cell count, unspecified: Secondary | ICD-10-CM

## 2014-09-14 DIAGNOSIS — R509 Fever, unspecified: Secondary | ICD-10-CM

## 2014-09-14 LAB — URINE CULTURE

## 2014-09-14 NOTE — Progress Notes (Signed)
[facility] PROGRESS NOTE  [name] GYJ:[id number] DOB: [birth year] DOA: 09/12/2014 PCP: Allena Katz, MD  Assessment/Plan:  SIRS -Presenting with fever, tachycardia, leukocytosis. -Source is what appears to be a liver abscess. -Placed on IV Zosyn -blood/fungus cxs pending -Sepsis parameters improved today.  Liver Abscess - S/p aspiration today. -Cx pending. -On Zosyn. -Requested ID consult with Dr. Baxter Flattery for further antibiotic recommendations and length of treatment.  DVT Prophylaxis SCDs Code Status: Full Family Communication: Patient only. Disposition Plan: Inpatient   Consultants:  IR  ID  Procedures:  Aspiration 09/13/14  Antibiotics:  Zosyn 1/21 >>  HPI [name] a 64 yo female, previously healthy, that presents to the ED with 2 week history of RUQ abdominal pain. Pain is associated with right shoulder pain, and reports decreased appetite.  She denies any fever, chills, N/V/D.  In ED, pt febrile, tachycardic, and with leukocytosis. Abd [place] with hypoechoic mass of liver. MRI liver with multiple enhancing hepatic lesions, representing metastatic disease or abscess. Patient is admitted for assessment. S/p aspiration of liver abscess on 1/22 yielding 15 cc of pus sent for cultures.   Subjective Feels much improved today. Some pain at the aspiration site.  Objective: Filed Vitals:   09/14/14 1316  BP: 120/53  Pulse: 88  Temp: 98.4 F (36.9 C)  Resp: 16    Intake/Output Summary (Last 24 hours) at 09/14/14 1639 Last data filed at 09/14/14 1622  Gross per 24 hour  Intake   2275 ml  Output   3550 ml  Net  -1275 ml   Filed Weights   09/12/14 2156 09/13/14 0134  Weight: 71.668 kg (158 lb) 72.1 kg (158 lb 15.2 oz)    Exam:  Gen: Alert Caucasian female in NAD.  HEENT: Normocephalic, atraumatic.  Pupils symmertrical.  Moist mucosa.   Chest: clear to auscultate bilaterally, no ronchi or rales  Cardiac: Regular rate and rhythm,  S1-S2, no rubs murmurs or gallops  Abdomen: soft, tenderness in RUQ, non distended, +bowel sounds. No guarding or rigidity  Extremities: Symmetrical in appearance without cyanosis or edema  Neurological: Alert awake oriented to time place and person.    Data Reviewed: Basic Metabolic Panel:  Recent Labs Lab 09/12/14 2227 09/13/14 0655  NA 131* 139  K 3.5 3.5  CL 94* 104  CO2 30 26  GLUCOSE 138* 119*  BUN 5* <5*  CREATININE 0.78 0.70  CALCIUM 8.2* 8.0*   Liver Function Tests:  Recent Labs Lab 09/12/14 2227  AST 57*  ALT 49*  ALKPHOS 185*  BILITOT 1.0  PROT 6.8  ALBUMIN 2.7*   No results for input(s): LIPASE, AMYLASE in the last 168 hours. No results for input(s): AMMONIA in the last 168 hours. CBC:  Recent Labs Lab 09/12/14 2227 09/13/14 0655  WBC 20.5* 16.7*  NEUTROABS 16.7*  --   HGB 12.3 11.9*  HCT 35.5* 35.3*  MCV 87.2 86.9  PLT 395 333   Cardiac Enzymes: No results for input(s): CKTOTAL, CKMB, CKMBINDEX, TROPONINI in the last 168 hours. BNP (last 3 results) No results for input(s): PROBNP in the last 8760 hours. CBG: No results for input(s): GLUCAP in the last 168 hours.  Recent Results (from the past 240 hour(s))  Blood culture (routine x 2)     Status: None (Preliminary result)   Collection Time: 09/12/14 10:48 PM  Result Value Ref Range Status   Specimen Description BLOOD RIGHT ARM  Final   Special Requests BOTTLES DRAWN AEROBIC AND ANAEROBIC 5CC  Final   Culture   Final           BLOOD CULTURE RECEIVED NO GROWTH TO DATE CULTURE WILL BE HELD FOR 5 DAYS BEFORE ISSUING A FINAL NEGATIVE REPORT Performed at [facility]    Report Status PENDING  Incomplete  Blood culture (routine x 2)     Status: None (Preliminary result)   Collection Time: 09/12/14 10:56 PM  Result Value Ref Range Status   Specimen Description BLOOD LEFT HAND  Final   Special Requests BOTTLES DRAWN AEROBIC AND ANAEROBIC 5CC  Final   Culture   Final           BLOOD  CULTURE RECEIVED NO GROWTH TO DATE CULTURE WILL BE HELD FOR 5 DAYS BEFORE ISSUING A FINAL NEGATIVE REPORT Performed at [facility]    Report Status PENDING  Incomplete  Urine culture     Status: None   Collection Time: 09/13/14 12:26 AM  Result Value Ref Range Status   Specimen Description URINE, RANDOM  Final   Special Requests NONE  Final   Colony Count   Final    25,000 COLONIES/ML Performed at [facility]    Culture   Final    Multiple bacterial morphotypes present, none predominant. Suggest appropriate recollection if clinically indicated. Performed at [facility]    Report Status 09/14/2014 FINAL  Final  Body fluid culture     Status: None (Preliminary result)   Collection Time: 09/13/14  2:40 PM  Result Value Ref Range Status   Specimen Description FLUID LIVER  Final   Special Requests NONE  Final   Gram Stain   Final    MODERATE WBC PRESENT,BOTH PMN AND MONONUCLEAR NO ORGANISMS SEEN Performed at [facility]    Culture NO GROWTH Performed at [facility]   Final   Report Status PENDING  Incomplete     Studies: Ct Image Guided Drainage By Percutaneous Catheter  09/13/2014   CLINICAL DATA:  Multiple liver fluid collections  EXAM: CT IMAGE GUIDED DRAINAGE BY PERCUTANEOUS CATHETER  FLUOROSCOPY TIME:  None  MEDICATIONS AND MEDICAL HISTORY: Versed 1 mg, Fentanyl 50 mcg.  Additional Medications: None.  ANESTHESIA/SEDATION: Moderate sedation time: 5 minutes  CONTRAST:  None  PROCEDURE: The procedure, risks, benefits, and alternatives were explained to the patient. Questions regarding the procedure were encouraged and answered. The patient understands and consents to the procedure.  The right upper quadrant was prepped with Betadine in a sterile fashion, and a sterile drape was applied covering the operative field. A sterile gown and sterile gloves were used for the procedure.  Under CT guidance, an 18 gauge needle was inserted into a  fluid collection in the right lobe of the liver. 15 cc of pus was aspirated. Final imaging was performed. A sample was sent for cytology and culture.  FINDINGS: Images document needle placement in a right lobe liver fluid collection. Post aspiration images demonstrate no hemorrhage.  COMPLICATIONS: None  IMPRESSION: Successful right lobe liver fluid collection aspiration for cytology and culture.   Electronically Signed   By: Maryclare Bean M.D.   On: 09/13/2014 17:04    Scheduled Meds: . piperacillin-tazobactam (ZOSYN)  IV  3.375 g Intravenous Q8H  . sodium chloride  3 mL Intravenous Q12H   Continuous Infusions: . sodium chloride 125 mL/hr at 09/14/14 1427    Principal Problem:   Liver abscess Active Problems:   Abdominal abscess   Lesion of liver   Sepsis  Time spent: 35 minutes    [facility] Pager [phone].  If 7PM-7AM, please contact night-coverage at [email or web address], password [facility] 09/14/2014, 4:39 PM  LOS: 2 days

## 2014-09-14 NOTE — Consult Note (Addendum)
[facility]  Total days of antibiotics 3        Day 3 piptazo               Reason for Consult: liver abscess    Referring Physician: hernandez  Principal Problem:   Liver abscess Active Problems:   Abdominal abscess   Lesion of liver   Sepsis    HPI: [name] is a 64 y.o. female with no significant past medical history started to have abdominal pain with referred right shoulder pain for roughly 2 wks, this coincided with loss of appetite. Denies fever, chills, nightsweat. No recent travel, no diarrheal illnesses, no dental work. She went to pcp for further evaluation. She had RUQ u/s on 1/13 that showed numerous hypoechogenic nodules, thus had   o/p MRI on 1/20 of her abdomen which indicated multiple peripherally enhancing hepatic lesions concerning for abscess rather than malignancy. Thus referred to the ED/admitted on 1/21 for further management.  She was found to have fever of 101.69F,tachycardia and leukocytosis of 20K with slight left shift. Blood cx were drawn in addition to IR doing aspiration on 1/22. She was empirically started on piptazo.  Allergies: No Known Allergies  Past med hx: S/p bladder surgery   MEDICATIONS: . piperacillin-tazobactam (ZOSYN)  IV  3.375 g Intravenous Q8H  . sodium chloride  3 mL Intravenous Q12H    History  Substance Use Topics  . Smoking status: Never Smoker   . Smokeless tobacco: Not on file  . Alcohol Use: No    History reviewed. No pertinent family history.  Review of Systems  Constitutional: positive for decreased appetite, 2lb weight loss. Negative for fever, chills, diaphoresis, activity change, , fatigue and unexpected weight change.  HENT: Negative for congestion, sore throat, rhinorrhea, sneezing, trouble swallowing and sinus pressure.  Eyes: Negative for photophobia and visual disturbance.  Respiratory: Negative for cough, chest tightness, shortness of breath, wheezing and stridor.    Cardiovascular: Negative for chest pain, palpitations and leg swelling.  Gastrointestinal: + right upper quadrant pain, and right shoulder pain. Negative for nausea, vomiting, abdominal pain, diarrhea, constipation, blood in stool, abdominal distention and anal bleeding.  Genitourinary: Negative for dysuria, hematuria, flank pain and difficulty urinating.  Musculoskeletal: Negative for myalgias, back pain, joint swelling, arthralgias and gait problem.  Skin: Negative for color change, pallor, rash and wound.  Neurological: Negative for dizziness, tremors, weakness and light-headedness.  Hematological: Negative for adenopathy. Does not bruise/bleed easily.  Psychiatric/Behavioral: Negative for behavioral problems, confusion, sleep disturbance, dysphoric mood, decreased concentration and agitation.     OBJECTIVE: Temp:  [98.4 F (36.9 C)-99.4 F (37.4 C)] 98.4 F (36.9 C) (01/22 1316) Pulse Rate:  [88-94] 88 (01/22 1316) Resp:  [15-16] 16 (01/22 1316) BP: (120-127)/(53-66) 120/53 mmHg (01/22 1316) SpO2:  [92 %-99 %] 99 % (01/22 1316) Constitutional:  oriented to person, place, and time. appears well-developed and well-nourished. No distress.  HENT:  Mouth/Throat: Oropharynx is clear and moist. No oropharyngeal exudate.  Cardiovascular: Normal rate, regular rhythm and normal heart sounds. Exam reveals no gallop and no friction rub.  No murmur heard.  Pulmonary/Chest: Effort normal and breath sounds normal. No respiratory distress.  has no wheezes.  Abdominal: Soft. Bowel sounds are normal.  exhibits no distension. Mild tenderness to ruq, and wincing when taking a deep breath Lymphadenopathy: no cervical adenopathy.  Neurological: alert and oriented to person, place, and time.  Skin: Skin is warm and dry. No rash  noted. No erythema.  Psychiatric: a normal mood and affect. behavior is normal.   LABS: Results for orders placed or performed during the hospital encounter of 09/12/14 (from  the past 48 hour(s))  Comprehensive metabolic panel     Status: Abnormal   Collection Time: 09/12/14 10:27 PM  Result Value Ref Range   Sodium 131 (L) 135 - 145 mmol/L    Comment: Please note change in reference range.   Potassium 3.5 3.5 - 5.1 mmol/L    Comment: Please note change in reference range.   Chloride 94 (L) 96 - 112 mEq/L   CO2 30 19 - 32 mmol/L   Glucose, Bld 138 (H) 70 - 99 mg/dL   BUN 5 (L) 6 - 23 mg/dL   Creatinine, Ser 0.78 0.50 - 1.10 mg/dL   Calcium 8.2 (L) 8.4 - 10.5 mg/dL   Total Protein 6.8 6.0 - 8.3 g/dL   Albumin 2.7 (L) 3.5 - 5.2 g/dL   AST 57 (H) 0 - 37 U/L   ALT 49 (H) 0 - 35 U/L   Alkaline Phosphatase 185 (H) 39 - 117 U/L   Total Bilirubin 1.0 0.3 - 1.2 mg/dL   GFR calc non Af Amer 87 (L) >90 mL/min   GFR calc Af Amer >90 >90 mL/min    Comment: (NOTE) The eGFR has been calculated using the CKD EPI equation. This calculation has not been validated in all clinical situations. eGFR's persistently <90 mL/min signify possible Chronic Kidney Disease.    Anion gap 7 5 - 15  CBC with Differential     Status: Abnormal   Collection Time: 09/12/14 10:27 PM  Result Value Ref Range   WBC 20.5 (H) 4.0 - 10.5 K/uL   RBC 4.07 3.87 - 5.11 MIL/uL   Hemoglobin 12.3 12.0 - 15.0 g/dL   HCT 35.5 (L) 36.0 - 46.0 %   MCV 87.2 78.0 - 100.0 fL   MCH 30.2 26.0 - 34.0 pg   MCHC 34.6 30.0 - 36.0 g/dL   RDW 12.5 11.5 - 15.5 %   Platelets 395 150 - 400 K/uL   Neutrophils Relative % 82 (H) 43 - 77 %   Neutro Abs 16.7 (H) 1.7 - 7.7 K/uL   Lymphocytes Relative 10 (L) 12 - 46 %   Lymphs Abs 2.1 0.7 - 4.0 K/uL   Monocytes Relative 8 3 - 12 %   Monocytes Absolute 1.7 (H) 0.1 - 1.0 K/uL   Eosinophils Relative 0 0 - 5 %   Eosinophils Absolute 0.1 0.0 - 0.7 K/uL   Basophils Relative 0 0 - 1 %   Basophils Absolute 0.0 0.0 - 0.1 K/uL  Blood culture (routine x 2)     Status: None (Preliminary result)   Collection Time: 09/12/14 10:48 PM  Result Value Ref Range   Specimen  Description BLOOD RIGHT ARM    Special Requests BOTTLES DRAWN AEROBIC AND ANAEROBIC 5CC    Culture             BLOOD CULTURE RECEIVED NO GROWTH TO DATE CULTURE WILL BE HELD FOR 5 DAYS BEFORE ISSUING A FINAL NEGATIVE REPORT Performed at [facility]    Report Status PENDING   Blood culture (routine x 2)     Status: None (Preliminary result)   Collection Time: 09/12/14 10:56 PM  Result Value Ref Range   Specimen Description BLOOD LEFT HAND    Special Requests BOTTLES DRAWN AEROBIC AND ANAEROBIC 5CC    Culture  BLOOD CULTURE RECEIVED NO GROWTH TO DATE CULTURE WILL BE HELD FOR 5 DAYS BEFORE ISSUING A FINAL NEGATIVE REPORT Performed at [facility]    Report Status PENDING   I-Stat CG4 Lactic Acid, ED     Status: None   Collection Time: 09/12/14 11:01 PM  Result Value Ref Range   Lactic Acid, Venous 0.61 0.5 - 2.2 mmol/L  Urinalysis, Routine w reflex microscopic     Status: None   Collection Time: 09/13/14 12:26 AM  Result Value Ref Range   Color, Urine YELLOW YELLOW   APPearance CLEAR CLEAR   Specific Gravity, Urine 1.007 1.005 - 1.030   pH 6.5 5.0 - 8.0   Glucose, UA NEGATIVE NEGATIVE mg/dL   Hgb urine dipstick NEGATIVE NEGATIVE   Bilirubin Urine NEGATIVE NEGATIVE   Ketones, ur NEGATIVE NEGATIVE mg/dL   Protein, ur NEGATIVE NEGATIVE mg/dL   Urobilinogen, UA 1.0 0.0 - 1.0 mg/dL   Nitrite NEGATIVE NEGATIVE   Leukocytes, UA NEGATIVE NEGATIVE    Comment: MICROSCOPIC NOT DONE ON URINES WITH NEGATIVE PROTEIN, BLOOD, LEUKOCYTES, NITRITE, OR GLUCOSE <1000 mg/dL.  Urine culture     Status: None   Collection Time: 09/13/14 12:26 AM  Result Value Ref Range   Specimen Description URINE, RANDOM    Special Requests NONE    Colony Count      25,000 COLONIES/ML Performed at [facility]    Culture      Multiple bacterial morphotypes present, none predominant. Suggest appropriate recollection if clinically indicated. Performed at [facility]      Report Status 09/14/2014 FINAL   CBC     Status: Abnormal   Collection Time: 09/13/14  6:55 AM  Result Value Ref Range   WBC 16.7 (H) 4.0 - 10.5 K/uL   RBC 4.06 3.87 - 5.11 MIL/uL   Hemoglobin 11.9 (L) 12.0 - 15.0 g/dL   HCT 35.3 (L) 36.0 - 46.0 %   MCV 86.9 78.0 - 100.0 fL   MCH 29.3 26.0 - 34.0 pg   MCHC 33.7 30.0 - 36.0 g/dL   RDW 12.6 11.5 - 15.5 %   Platelets 333 150 - 400 K/uL  Basic metabolic panel     Status: Abnormal   Collection Time: 09/13/14  6:55 AM  Result Value Ref Range   Sodium 139 135 - 145 mmol/L    Comment: Please note change in reference range.   Potassium 3.5 3.5 - 5.1 mmol/L    Comment: Please note change in reference range.   Chloride 104 96 - 112 mEq/L   CO2 26 19 - 32 mmol/L   Glucose, Bld 119 (H) 70 - 99 mg/dL   BUN <5 (L) 6 - 23 mg/dL    Comment: CONSISTENT WITH PREVIOUS RESULT   Creatinine, Ser 0.70 0.50 - 1.10 mg/dL   Calcium 8.0 (L) 8.4 - 10.5 mg/dL   GFR calc non Af Amer 90 (L) >90 mL/min   GFR calc Af Amer >90 >90 mL/min    Comment: (NOTE) The eGFR has been calculated using the CKD EPI equation. This calculation has not been validated in all clinical situations. eGFR's persistently <90 mL/min signify possible Chronic Kidney Disease.    Anion gap 9 5 - 15  APTT     Status: None   Collection Time: 09/13/14  7:53 AM  Result Value Ref Range   aPTT 32 24 - 37 seconds  Protime-INR     Status: Abnormal   Collection Time: 09/13/14  7:53 AM  Result Value Ref Range   Prothrombin Time 18.1 (H) 11.6 - 15.2 seconds   INR 1.48 0.00 - 1.49  Body fluid culture     Status: None (Preliminary result)   Collection Time: 09/13/14  2:40 PM  Result Value Ref Range   Specimen Description FLUID LIVER    Special Requests NONE    Gram Stain      MODERATE WBC PRESENT,BOTH PMN AND MONONUCLEAR NO ORGANISMS SEEN Performed at [facility]    Culture NO GROWTH Performed at [facility]     Report Status PENDING     MICRO: 1/21 blood cx  pending 1/22 liver aspirate cx pending  IMAGING: Ct Image Guided Drainage By Percutaneous Catheter  09/13/2014   CLINICAL DATA:  Multiple liver fluid collections  EXAM: CT IMAGE GUIDED DRAINAGE BY PERCUTANEOUS CATHETER  FLUOROSCOPY TIME:  None  MEDICATIONS AND MEDICAL HISTORY: Versed 1 mg, Fentanyl 50 mcg.  Additional Medications: None.  ANESTHESIA/SEDATION: Moderate sedation time: 5 minutes  CONTRAST:  None  PROCEDURE: The procedure, risks, benefits, and alternatives were explained to the patient. Questions regarding the procedure were encouraged and answered. The patient understands and consents to the procedure.  The right upper quadrant was prepped with Betadine in a sterile fashion, and a sterile drape was applied covering the operative field. A sterile gown and sterile gloves were used for the procedure.  Under CT guidance, an 18 gauge needle was inserted into a fluid collection in the right lobe of the liver. 15 cc of pus was aspirated. Final imaging was performed. A sample was sent for cytology and culture.  FINDINGS: Images document needle placement in a right lobe liver fluid collection. Post aspiration images demonstrate no hemorrhage.  COMPLICATIONS: None  IMPRESSION: Successful right lobe liver fluid collection aspiration for cytology and culture.   Electronically Signed   By: Maryclare Bean M.D.   On: 09/13/2014 17:04  abdominal MRI 1/20: Hepatobiliary: No evidence of cirrhosis. Multiple heterogeneous T2 hyperintense, peripherally enhancing lesions are identified throughout both lobes of the liver. Peripheral enhancing septae identified within the majority of these lesions. Index lesions include the high right hepatic lobe 3.9 x 3.9 cm lesion on image 7 of series 3. Anterior segment left liver lobe subcapsular 4.5 x 3.0 cm lesion on image 9 of series 3. Posterior segment right lobe 3.3 x 2.1 cm lesion on image 19 of series 3.  Ultrasound on 1/13: Liver: The hepatic echotexture is normal.  There are numerous hypoechoic nodules demonstrated with the largest lying along the anterior surface of the right lobe measuring 2.7 x 2.2 x 3 cm. There are internal echoes but there is no increased vascularity. There is no intrahepatic ductal dilation.  Assessment/Plan:  64yo F with 2-3 wk hx of abdominal pain found to have numerous enhancing lesions on abdominal mri concerning for hepatic abscess vs. Malignancy. She was admitted with leukocytosis and fever, thus favoring liver abscess.   - continue with piptazo until cx return to determine how we can narrow regimen - if blood cultures are positive, will recommend to get TTE.  - health maintenance, will check hep c and hiv ab.  Right shoulder pain = likely referred pain from liver abscesses, continue with prn schedule to determine if need to schedule meds  Leukocytosis = likely from infection, appears trending downward from antibiotics  Further recs to be provided by Dr. Johnnye Sima over the weekend.

## 2014-09-15 LAB — HEPATITIS C ANTIBODY: HCV Ab: NEGATIVE

## 2014-09-15 LAB — COMPREHENSIVE METABOLIC PANEL
ALK PHOS: 152 U/L — AB (ref 39–117)
ALT: 37 U/L — ABNORMAL HIGH (ref 0–35)
ANION GAP: 8 (ref 5–15)
AST: 35 U/L (ref 0–37)
Albumin: 2.2 g/dL — ABNORMAL LOW (ref 3.5–5.2)
BILIRUBIN TOTAL: 0.9 mg/dL (ref 0.3–1.2)
CALCIUM: 8 mg/dL — AB (ref 8.4–10.5)
CO2: 26 mmol/L (ref 19–32)
CREATININE: 0.67 mg/dL (ref 0.50–1.10)
Chloride: 104 mmol/L (ref 96–112)
GFR calc Af Amer: >90 mL/min (ref >90)
GFR calc non Af Amer: >90 mL/min (ref >90)
GLUCOSE: 130 mg/dL — AB (ref 70–99)
POTASSIUM: 3.4 mmol/L — AB (ref 3.5–5.1)
Sodium: 138 mmol/L (ref 135–145)
TOTAL PROTEIN: 5.7 g/dL — AB (ref 6.0–8.3)

## 2014-09-15 LAB — CBC
HCT: 32.6 % — ABNORMAL LOW (ref 36.0–46.0)
HEMOGLOBIN: 10.8 g/dL — AB (ref 12.0–15.0)
MCH: 29.2 pg (ref 26.0–34.0)
MCHC: 33.1 g/dL (ref 30.0–36.0)
MCV: 88.1 fL (ref 78.0–100.0)
PLATELETS: 368 10*3/uL (ref 150–400)
RBC: 3.7 MIL/uL — ABNORMAL LOW (ref 3.87–5.11)
RDW: 12.9 % (ref 11.5–15.5)
WBC: 18.1 10*3/uL — ABNORMAL HIGH (ref 4.0–10.5)

## 2014-09-15 MED ORDER — SODIUM CHLORIDE 0.9 % IJ SOLN
10.0000 mL | INTRAMUSCULAR | Status: DC | PRN
  Administered 2014-09-16 – 2014-09-17 (×3): 10 mL
  Filled 2014-09-15 (×3): qty 40

## 2014-09-15 MED ORDER — SODIUM CHLORIDE 0.9 % IJ SOLN
10.0000 mL | Freq: Two times a day (BID) | INTRAMUSCULAR | Status: DC
  Administered 2014-09-17: 10 mL

## 2014-09-15 NOTE — Progress Notes (Signed)
Peripherally Inserted Central Catheter/Midline Placement  The IV Nurse has discussed with the patient and/or persons authorized to consent for the patient, the purpose of this procedure and the potential benefits and risks involved with this procedure.  The benefits include less needle sticks, lab draws from the catheter and patient may be discharged home with the catheter.  Risks include, but not limited to, infection, bleeding, blood clot (thrombus formation), and puncture of an artery; nerve damage and irregular heat beat.  Alternatives to this procedure were also discussed.  PICC/Midline Placement Documentation  PICC / Midline Single Lumen 09/15/14 PICC Right Cephalic 36 cm 0 cm (Active)  Indication for Insertion or Continuance of Line Home intravenous therapies (PICC only) 09/15/2014  5:00 PM  Exposed Catheter (cm) 0 cm 09/15/2014  5:00 PM  Line Status Flushed;Saline locked;Blood return noted 09/15/2014  5:00 PM  Dressing Change Due 09/22/14 09/15/2014  5:00 PM       [facility] Payment 09/15/2014, 5:01 PM

## 2014-09-15 NOTE — Progress Notes (Addendum)
INFECTIOUS DISEASE PROGRESS NOTE  ID: [name] is a 64 y.o. female with  Principal Problem:   Liver abscess Active Problems:   Abdominal abscess   Lesion of liver   Sepsis  Subjective: Continued R shoulder pain  Abtx:  Anti-infectives    Start     Dose/Rate Route Frequency Ordered Stop   09/13/14 1100  piperacillin-tazobactam (ZOSYN) IVPB 3.375 g     3.375 g12.5 mL/hr over 240 Minutes Intravenous Every 8 hours 09/13/14 1030     09/12/14 2330  piperacillin-tazobactam (ZOSYN) IVPB 3.375 g     3.375 g100 mL/hr over 30 Minutes Intravenous  Once 09/12/14 2320 09/13/14 0045      Medications:  Scheduled: . piperacillin-tazobactam (ZOSYN)  IV  3.375 g Intravenous Q8H  . sodium chloride  3 mL Intravenous Q12H    Objective: Vital signs in last 24 hours: Temp:  [98.2 F (36.8 C)-98.7 F (37.1 C)] 98.2 F (36.8 C) (01/23 0548) Pulse Rate:  [86-100] 86 (01/23 0548) Resp:  [16-18] 16 (01/23 0548) BP: (120-133)/(53-62) 126/62 mmHg (01/23 0548) SpO2:  [93 %-99 %] 95 % (01/23 0548)   General appearance: alert, cooperative and no distress GI: normal findings: bowel sounds normal and soft, non-tender  Lab Results  Recent Labs  09/13/14 0655 09/15/14 0512  WBC 16.7* 18.1*  HGB 11.9* 10.8*  HCT 35.3* 32.6*  NA 139 138  K 3.5 3.4*  CL 104 104  CO2 26 26  BUN <5* <5*  CREATININE 0.70 0.67   Liver Panel  Recent Labs  09/12/14 2227 09/15/14 0512  PROT 6.8 5.7*  ALBUMIN 2.7* 2.2*  AST 57* 35  ALT 49* 37*  ALKPHOS 185* 152*  BILITOT 1.0 0.9   Sedimentation Rate No results for input(s): ESRSEDRATE in the last 72 hours. C-Reactive Protein No results for input(s): CRP in the last 72 hours.  Microbiology: Recent Results (from the past 240 hour(s))  Blood culture (routine x 2)     Status: None (Preliminary result)   Collection Time: 09/12/14 10:48 PM  Result Value Ref Range Status   Specimen Description BLOOD RIGHT ARM  Final   Special Requests BOTTLES  DRAWN AEROBIC AND ANAEROBIC 5CC  Final   Culture   Final           BLOOD CULTURE RECEIVED NO GROWTH TO DATE CULTURE WILL BE HELD FOR 5 DAYS BEFORE ISSUING A FINAL NEGATIVE REPORT Performed at [facility]    Report Status PENDING  Incomplete  Blood culture (routine x 2)     Status: None (Preliminary result)   Collection Time: 09/12/14 10:56 PM  Result Value Ref Range Status   Specimen Description BLOOD LEFT HAND  Final   Special Requests BOTTLES DRAWN AEROBIC AND ANAEROBIC 5CC  Final   Culture   Final           BLOOD CULTURE RECEIVED NO GROWTH TO DATE CULTURE WILL BE HELD FOR 5 DAYS BEFORE ISSUING A FINAL NEGATIVE REPORT Performed at [facility]    Report Status PENDING  Incomplete  Urine culture     Status: None   Collection Time: 09/13/14 12:26 AM  Result Value Ref Range Status   Specimen Description URINE, RANDOM  Final   Special Requests NONE  Final   Colony Count   Final    25,000 COLONIES/ML Performed at [facility]    Culture   Final    Multiple bacterial morphotypes present, none predominant. Suggest appropriate recollection if  clinically indicated. Performed at [facility]    Report Status 09/14/2014 FINAL  Final  Body fluid culture     Status: None (Preliminary result)   Collection Time: 09/13/14  2:40 PM  Result Value Ref Range Status   Specimen Description FLUID LIVER  Final   Special Requests NONE  Final   Gram Stain   Final    MODERATE WBC PRESENT,BOTH PMN AND MONONUCLEAR NO ORGANISMS SEEN Performed at [facility]    Culture   Final    NO GROWTH 2 DAYS Performed at [facility]    Report Status PENDING  Incomplete    Studies/Results: Ct Image Guided Drainage By Percutaneous Catheter  09/13/2014   CLINICAL DATA:  Multiple liver fluid collections  EXAM: CT IMAGE GUIDED DRAINAGE BY PERCUTANEOUS CATHETER  FLUOROSCOPY TIME:  None  MEDICATIONS AND MEDICAL HISTORY: Versed 1 mg, Fentanyl 50 mcg.  Additional  Medications: None.  ANESTHESIA/SEDATION: Moderate sedation time: 5 minutes  CONTRAST:  None  PROCEDURE: The procedure, risks, benefits, and alternatives were explained to the patient. Questions regarding the procedure were encouraged and answered. The patient understands and consents to the procedure.  The right upper quadrant was prepped with Betadine in a sterile fashion, and a sterile drape was applied covering the operative field. A sterile gown and sterile gloves were used for the procedure.  Under CT guidance, an 18 gauge needle was inserted into a fluid collection in the right lobe of the liver. 15 cc of pus was aspirated. Final imaging was performed. A sample was sent for cytology and culture.  FINDINGS: Images document needle placement in a right lobe liver fluid collection. Post aspiration images demonstrate no hemorrhage.  COMPLICATIONS: None  IMPRESSION: Successful right lobe liver fluid collection aspiration for cytology and culture.   Electronically Signed   By: Maryclare Bean M.D.   On: 09/13/2014 17:04     Assessment/Plan: Liver abscess (multiple)  Total days of antibiotics 3 (zosyn) Will continue current anbx Await Cx data HIV Ab pending Place [facility] If Cx (-) would consider home with 3 weeks of ertapenem, repeat CT scan at end of therapy, ID clinic appt, to determine length of therapy.          Bobby Rumpf Infectious Diseases (pager) [phone] [email or web address] 09/15/2014, 12:41 PM  LOS: 3 days

## 2014-09-15 NOTE — Progress Notes (Signed)
[facility] PROGRESS NOTE  [name] ZJI:[id number] DOB: 01-01-1951 DOA: 09/12/2014 PCP: Allena Katz, MD  Assessment/Plan:  SIRS -Presenting with fever, tachycardia, leukocytosis. -Source is what appears to be a liver abscess. -Placed on IV Zosyn -blood/fungus cxs pending. -Pathology negative for malignant cells. -Sepsis parameters improved today.  Liver Abscess - S/p aspiration 1/22. -Cx pending. -On Zosyn. -Appreciate ID input. If cx remain negative, DC on 3 weeks of ertapenem with ID follow up.  DVT Prophylaxis SCDs Code Status: Full Family Communication: Patient only. Disposition Plan: Inpatient   Consultants:  IR  ID  Procedures:  Aspiration 09/13/14  Antibiotics:  Zosyn 1/21 >>  HPI [name] a 64 yo female, previously healthy, that presents to the ED with 2 week history of RUQ abdominal pain. Pain is associated with right shoulder pain, and reports decreased appetite.  She denies any fever, chills, N/V/D.  In ED, pt febrile, tachycardic, and with leukocytosis. Abd [place] with hypoechoic mass of liver. MRI liver with multiple enhancing hepatic lesions, representing metastatic disease or abscess. Patient is admitted for assessment. S/p aspiration of liver abscess on 1/22 yielding 15 cc of pus sent for cultures.   Subjective Feels much improved today. Some pain at the aspiration site.  Objective: Filed Vitals:   09/15/14 1547  BP: 139/64  Pulse: 93  Temp: 99.2 F (37.3 C)  Resp: 20    Intake/Output Summary (Last 24 hours) at 09/15/14 1609 Last data filed at 09/15/14 1514  Gross per 24 hour  Intake    890 ml  Output   2900 ml  Net  -2010 ml   Filed Weights   09/12/14 2156 09/13/14 0134  Weight: 71.668 kg (158 lb) 72.1 kg (158 lb 15.2 oz)    Exam:  Gen: Alert Caucasian female in NAD.  HEENT: Normocephalic, atraumatic.  Pupils symmertrical.  Moist mucosa.   Chest: clear to auscultate bilaterally, no ronchi or rales  Cardiac:  Regular rate and rhythm, S1-S2, no rubs murmurs or gallops  Abdomen: soft, tenderness in RUQ, non distended, +bowel sounds. No guarding or rigidity  Extremities: Symmetrical in appearance without cyanosis or edema  Neurological: Alert awake oriented to time place and person.    Data Reviewed: Basic Metabolic Panel:  Recent Labs Lab 09/12/14 2227 09/13/14 0655 09/15/14 0512  NA 131* 139 138  K 3.5 3.5 3.4*  CL 94* 104 104  CO2 30 26 26   GLUCOSE 138* 119* 130*  BUN 5* <5* <5*  CREATININE 0.78 0.70 0.67  CALCIUM 8.2* 8.0* 8.0*   Liver Function Tests:  Recent Labs Lab 09/12/14 2227 09/15/14 0512  AST 57* 35  ALT 49* 37*  ALKPHOS 185* 152*  BILITOT 1.0 0.9  PROT 6.8 5.7*  ALBUMIN 2.7* 2.2*   No results for input(s): LIPASE, AMYLASE in the last 168 hours. No results for input(s): AMMONIA in the last 168 hours. CBC:  Recent Labs Lab 09/12/14 2227 09/13/14 0655 09/15/14 0512  WBC 20.5* 16.7* 18.1*  NEUTROABS 16.7*  --   --   HGB 12.3 11.9* 10.8*  HCT 35.5* 35.3* 32.6*  MCV 87.2 86.9 88.1  PLT 395 333 368   Cardiac Enzymes: No results for input(s): CKTOTAL, CKMB, CKMBINDEX, TROPONINI in the last 168 hours. BNP (last 3 results) No results for input(s): PROBNP in the last 8760 hours. CBG: No results for input(s): GLUCAP in the last 168 hours.  Recent Results (from the past 240 hour(s))  Blood culture (routine x 2)  Status: None (Preliminary result)   Collection Time: 09/12/14 10:48 PM  Result Value Ref Range Status   Specimen Description BLOOD RIGHT ARM  Final   Special Requests BOTTLES DRAWN AEROBIC AND ANAEROBIC 5CC  Final   Culture   Final           BLOOD CULTURE RECEIVED NO GROWTH TO DATE CULTURE WILL BE HELD FOR 5 DAYS BEFORE ISSUING A FINAL NEGATIVE REPORT Performed at [facility]    Report Status PENDING  Incomplete  Blood culture (routine x 2)     Status: None (Preliminary result)   Collection Time: 09/12/14 10:56 PM  Result Value Ref  Range Status   Specimen Description BLOOD LEFT HAND  Final   Special Requests BOTTLES DRAWN AEROBIC AND ANAEROBIC 5CC  Final   Culture   Final           BLOOD CULTURE RECEIVED NO GROWTH TO DATE CULTURE WILL BE HELD FOR 5 DAYS BEFORE ISSUING A FINAL NEGATIVE REPORT Performed at [facility]    Report Status PENDING  Incomplete  Urine culture     Status: None   Collection Time: 09/13/14 12:26 AM  Result Value Ref Range Status   Specimen Description URINE, RANDOM  Final   Special Requests NONE  Final   Colony Count   Final    25,000 COLONIES/ML Performed at [facility]    Culture   Final    Multiple bacterial morphotypes present, none predominant. Suggest appropriate recollection if clinically indicated. Performed at [facility]    Report Status 09/14/2014 FINAL  Final  Body fluid culture     Status: None (Preliminary result)   Collection Time: 09/13/14  2:40 PM  Result Value Ref Range Status   Specimen Description FLUID LIVER  Final   Special Requests NONE  Final   Gram Stain   Final    MODERATE WBC PRESENT,BOTH PMN AND MONONUCLEAR NO ORGANISMS SEEN Performed at [facility]    Culture   Final    NO GROWTH 2 DAYS Performed at [facility]    Report Status PENDING  Incomplete     Studies: No results found.  Scheduled Meds: . piperacillin-tazobactam (ZOSYN)  IV  3.375 g Intravenous Q8H  . sodium chloride  3 mL Intravenous Q12H   Continuous Infusions: . sodium chloride 75 mL/hr at 09/15/14 1251    Principal Problem:   Liver abscess Active Problems:   Abdominal abscess   Lesion of liver   Sepsis    Time spent: 25 minutes    [facility] Pager [phone].  If 7PM-7AM, please contact night-coverage at [email or web address], password [facility] 09/15/2014, 4:09 PM  LOS: 3 days

## 2014-09-16 ENCOUNTER — Inpatient Hospital Stay (HOSPITAL_COMMUNITY): Payer: BLUE CROSS/BLUE SHIELD

## 2014-09-16 LAB — CBC
HCT: 32.2 % — ABNORMAL LOW (ref 36.0–46.0)
Hemoglobin: 10.8 g/dL — ABNORMAL LOW (ref 12.0–15.0)
MCH: 29.2 pg (ref 26.0–34.0)
MCHC: 33.5 g/dL (ref 30.0–36.0)
MCV: 87 fL (ref 78.0–100.0)
Platelets: 381 10*3/uL (ref 150–400)
RBC: 3.7 MIL/uL — ABNORMAL LOW (ref 3.87–5.11)
RDW: 12.9 % (ref 11.5–15.5)
WBC: 17.7 10*3/uL — ABNORMAL HIGH (ref 4.0–10.5)

## 2014-09-16 LAB — HIV ANTIBODY (ROUTINE TESTING W REFLEX): HIV-1/HIV-2 Ab: NONREACTIVE

## 2014-09-16 LAB — BODY FLUID CULTURE: Culture: NO GROWTH

## 2014-09-16 MED ORDER — GADOBENATE DIMEGLUMINE 529 MG/ML IV SOLN
14.0000 mL | Freq: Once | INTRAVENOUS | Status: AC | PRN
  Administered 2014-09-16: 14 mL via INTRAVENOUS

## 2014-09-16 NOTE — Progress Notes (Signed)
[facility] PROGRESS NOTE  [name] EUM:[id number] DOB: [birth year] DOA: 09/12/2014 PCP: Allena Katz, MD  Assessment/Plan:  SIRS -Presenting with fever, tachycardia, leukocytosis. -Source is what appears to be a liver abscess. -Placed on IV Zosyn -blood/fungus cxs are negative. -Pathology negative for malignant cells. -Sepsis parameters improved today.  Liver Abscess - S/p aspiration 1/22. -Cx negative. -On Zosyn. -Due to continued pain and leukocytosis, will get repeat MRI today. -Appreciate ID input. Plan toDC on 3 weeks of ertapenem with ID follow up.  DVT Prophylaxis SCDs Code Status: Full Family Communication: Patient only. Disposition Plan: home in 1-2 days.   Consultants:  IR  ID  Procedures:  Aspiration 09/13/14  Antibiotics:  Zosyn 1/21 >>  HPI [name] a 63 yo female, previously healthy, that presents to the ED with 2 week history of RUQ abdominal pain. Pain is associated with right shoulder pain, and reports decreased appetite.  She denies any fever, chills, N/V/D.  In ED, pt febrile, tachycardic, and with leukocytosis. Abd [place] with hypoechoic mass of liver. MRI liver with multiple enhancing hepatic lesions, representing metastatic disease or abscess. Patient is admitted for assessment. S/p aspiration of liver abscess on 1/22 yielding 15 cc of pus sent for cultures.   Subjective Feels that RUQ has progressively worsened. Tolerating diet without issue.  Objective: Filed Vitals:   09/16/14 1337  BP: 138/64  Pulse: 81  Temp: 97.8 F (36.6 C)  Resp: 20    Intake/Output Summary (Last 24 hours) at 09/16/14 1448 Last data filed at 09/16/14 1339  Gross per 24 hour  Intake    720 ml  Output    900 ml  Net   -180 ml   Filed Weights   09/12/14 2156 09/13/14 0134  Weight: 71.668 kg (158 lb) 72.1 kg (158 lb 15.2 oz)    Exam:  Gen: Alert Caucasian female in NAD.  HEENT: Normocephalic, atraumatic.  Pupils symmertrical.  Moist  mucosa.   Chest: clear to auscultate bilaterally, no ronchi or rales  Cardiac: Regular rate and rhythm, S1-S2, no rubs murmurs or gallops  Abdomen: soft, tenderness in RUQ, non distended, +bowel sounds. No guarding or rigidity  Extremities: Symmetrical in appearance without cyanosis or edema  Neurological: Alert awake oriented to time place and person.    Data Reviewed: Basic Metabolic Panel:  Recent Labs Lab 09/12/14 2227 09/13/14 0655 09/15/14 0512  NA 131* 139 138  K 3.5 3.5 3.4*  CL 94* 104 104  CO2 30 26 26   GLUCOSE 138* 119* 130*  BUN 5* <5* <5*  CREATININE 0.78 0.70 0.67  CALCIUM 8.2* 8.0* 8.0*   Liver Function Tests:  Recent Labs Lab 09/12/14 2227 09/15/14 0512  AST 57* 35  ALT 49* 37*  ALKPHOS 185* 152*  BILITOT 1.0 0.9  PROT 6.8 5.7*  ALBUMIN 2.7* 2.2*   No results for input(s): LIPASE, AMYLASE in the last 168 hours. No results for input(s): AMMONIA in the last 168 hours. CBC:  Recent Labs Lab 09/12/14 2227 09/13/14 0655 09/15/14 0512 09/16/14 0630  WBC 20.5* 16.7* 18.1* 17.7*  NEUTROABS 16.7*  --   --   --   HGB 12.3 11.9* 10.8* 10.8*  HCT 35.5* 35.3* 32.6* 32.2*  MCV 87.2 86.9 88.1 87.0  PLT 395 333 368 381   Cardiac Enzymes: No results for input(s): CKTOTAL, CKMB, CKMBINDEX, TROPONINI in the last 168 hours. BNP (last 3 results) No results for input(s): PROBNP in the last 8760 hours. CBG: No results for input(s):  GLUCAP in the last 168 hours.  Recent Results (from the past 240 hour(s))  Blood culture (routine x 2)     Status: None (Preliminary result)   Collection Time: 09/12/14 10:48 PM  Result Value Ref Range Status   Specimen Description BLOOD RIGHT ARM  Final   Special Requests BOTTLES DRAWN AEROBIC AND ANAEROBIC 5CC  Final   Culture   Final           BLOOD CULTURE RECEIVED NO GROWTH TO DATE CULTURE WILL BE HELD FOR 5 DAYS BEFORE ISSUING A FINAL NEGATIVE REPORT Performed at [facility]    Report Status PENDING   Incomplete  Blood culture (routine x 2)     Status: None (Preliminary result)   Collection Time: 09/12/14 10:56 PM  Result Value Ref Range Status   Specimen Description BLOOD LEFT HAND  Final   Special Requests BOTTLES DRAWN AEROBIC AND ANAEROBIC 5CC  Final   Culture   Final           BLOOD CULTURE RECEIVED NO GROWTH TO DATE CULTURE WILL BE HELD FOR 5 DAYS BEFORE ISSUING A FINAL NEGATIVE REPORT Performed at [facility]    Report Status PENDING  Incomplete  Urine culture     Status: None   Collection Time: 09/13/14 12:26 AM  Result Value Ref Range Status   Specimen Description URINE, RANDOM  Final   Special Requests NONE  Final   Colony Count   Final    25,000 COLONIES/ML Performed at [facility]    Culture   Final    Multiple bacterial morphotypes present, none predominant. Suggest appropriate recollection if clinically indicated. Performed at [facility]    Report Status 09/14/2014 FINAL  Final  Body fluid culture     Status: None   Collection Time: 09/13/14  2:40 PM  Result Value Ref Range Status   Specimen Description FLUID LIVER  Final   Special Requests NONE  Final   Gram Stain   Final    MODERATE WBC PRESENT,BOTH PMN AND MONONUCLEAR NO ORGANISMS SEEN Performed at [facility]    Culture   Final    NO GROWTH 3 DAYS Performed at [facility]    Report Status 09/16/2014 FINAL  Final     Studies: No results found.  Scheduled Meds: . piperacillin-tazobactam (ZOSYN)  IV  3.375 g Intravenous Q8H  . sodium chloride  10-40 mL Intracatheter Q12H  . sodium chloride  3 mL Intravenous Q12H   Continuous Infusions: . sodium chloride 75 mL/hr at 09/15/14 1829    Principal Problem:   Liver abscess Active Problems:   Abdominal abscess   Lesion of liver   Sepsis    Time spent: 25 minutes    [facility] Pager [phone].  If 7PM-7AM, please contact night-coverage at [email or web address], password  [facility] 09/16/2014, 2:48 PM  LOS: 4 days

## 2014-09-16 NOTE — Progress Notes (Signed)
INFECTIOUS DISEASE PROGRESS NOTE  ID: [name] is a 64 y.o. female with  Principal Problem:   Liver abscess Active Problems:   Abdominal abscess   Lesion of liver   Sepsis  Subjective: Continued pain, having difficulty laying flat  Abtx:  Anti-infectives    Start     Dose/Rate Route Frequency Ordered Stop   09/13/14 1100  piperacillin-tazobactam (ZOSYN) IVPB 3.375 g     3.375 g12.5 mL/hr over 240 Minutes Intravenous Every 8 hours 09/13/14 1030     09/12/14 2330  piperacillin-tazobactam (ZOSYN) IVPB 3.375 g     3.375 g100 mL/hr over 30 Minutes Intravenous  Once 09/12/14 2320 09/13/14 0045      Medications:  Scheduled: . piperacillin-tazobactam (ZOSYN)  IV  3.375 g Intravenous Q8H  . sodium chloride  10-40 mL Intracatheter Q12H  . sodium chloride  3 mL Intravenous Q12H    Objective: Vital signs in last 24 hours: Temp:  [98.4 F (36.9 C)-99.4 F (37.4 C)] 98.4 F (36.9 C) (01/24 0614) Pulse Rate:  [85-93] 87 (01/24 0614) Resp:  [16-20] 16 (01/24 0614) BP: (133-139)/(64-71) 133/71 mmHg (01/24 0614) SpO2:  [97 %-98 %] 97 % (01/24 0614)   General appearance: alert, cooperative and no distress Resp: clear Cardio: regular rate and rhythm GI: normal findings: bowel sounds normal and soft, non-tender  Lab Results  Recent Labs  09/15/14 0512 09/16/14 0630  WBC 18.1* 17.7*  HGB 10.8* 10.8*  HCT 32.6* 32.2*  NA 138  --   K 3.4*  --   CL 104  --   CO2 26  --   BUN <5*  --   CREATININE 0.67  --    Liver Panel  Recent Labs  09/15/14 0512  PROT 5.7*  ALBUMIN 2.2*  AST 35  ALT 37*  ALKPHOS 152*  BILITOT 0.9   Sedimentation Rate No results for input(s): ESRSEDRATE in the last 72 hours. C-Reactive Protein No results for input(s): CRP in the last 72 hours.  Microbiology: Recent Results (from the past 240 hour(s))  Blood culture (routine x 2)     Status: None (Preliminary result)   Collection Time: 09/12/14 10:48 PM  Result Value Ref Range  Status   Specimen Description BLOOD RIGHT ARM  Final   Special Requests BOTTLES DRAWN AEROBIC AND ANAEROBIC 5CC  Final   Culture   Final           BLOOD CULTURE RECEIVED NO GROWTH TO DATE CULTURE WILL BE HELD FOR 5 DAYS BEFORE ISSUING A FINAL NEGATIVE REPORT Performed at [facility]    Report Status PENDING  Incomplete  Blood culture (routine x 2)     Status: None (Preliminary result)   Collection Time: 09/12/14 10:56 PM  Result Value Ref Range Status   Specimen Description BLOOD LEFT HAND  Final   Special Requests BOTTLES DRAWN AEROBIC AND ANAEROBIC 5CC  Final   Culture   Final           BLOOD CULTURE RECEIVED NO GROWTH TO DATE CULTURE WILL BE HELD FOR 5 DAYS BEFORE ISSUING A FINAL NEGATIVE REPORT Performed at [facility]    Report Status PENDING  Incomplete  Urine culture     Status: None   Collection Time: 09/13/14 12:26 AM  Result Value Ref Range Status   Specimen Description URINE, RANDOM  Final   Special Requests NONE  Final   Colony Count   Final    25,000 COLONIES/ML Performed at [facility]    Culture   Final    Multiple bacterial morphotypes present, none predominant. Suggest appropriate recollection if clinically indicated. Performed at [facility]    Report Status 09/14/2014 FINAL  Final  Body fluid culture     Status: None (Preliminary result)   Collection Time: 09/13/14  2:40 PM  Result Value Ref Range Status   Specimen Description FLUID LIVER  Final   Special Requests NONE  Final   Gram Stain   Final    MODERATE WBC PRESENT,BOTH PMN AND MONONUCLEAR NO ORGANISMS SEEN Performed at [facility]    Culture   Final    NO GROWTH 2 DAYS Performed at [facility]    Report Status PENDING  Incomplete    Studies/Results: No results found.   Assessment/Plan: Liver abscesses, multiple  Total days of antibiotics: 4 zosyn   check HIV With continued pain, elevated WBC, will repeat her MRI   If Cx remain  (-) would consider home with 3 weeks of ertapenem, repeat MRI scan at end of therapy, ID clinic appt, to determine length of therapy.        Bobby Rumpf Infectious Diseases (pager) [phone] [email or web address] 09/16/2014, 10:37 AM  LOS: 4 days

## 2014-09-17 LAB — DIFFERENTIAL
BASOS ABS: 0 10*3/uL (ref 0.0–0.1)
Basophils Relative: 0 % (ref 0–1)
EOS PCT: 1 % (ref 0–5)
Eosinophils Absolute: 0.3 10*3/uL (ref 0.0–0.7)
LYMPHS ABS: 2.5 10*3/uL (ref 0.7–4.0)
Lymphocytes Relative: 14 % (ref 12–46)
Monocytes Absolute: 1.5 10*3/uL — ABNORMAL HIGH (ref 0.1–1.0)
Monocytes Relative: 8 % (ref 3–12)
NEUTROS PCT: 77 % (ref 43–77)
Neutro Abs: 13.8 10*3/uL — ABNORMAL HIGH (ref 1.7–7.7)

## 2014-09-17 LAB — CBC
HCT: 34.7 % — ABNORMAL LOW (ref 36.0–46.0)
Hemoglobin: 11.5 g/dL — ABNORMAL LOW (ref 12.0–15.0)
MCH: 28.9 pg (ref 26.0–34.0)
MCHC: 33.1 g/dL (ref 30.0–36.0)
MCV: 87.2 fL (ref 78.0–100.0)
PLATELETS: 421 10*3/uL — AB (ref 150–400)
RBC: 3.98 MIL/uL (ref 3.87–5.11)
RDW: 12.9 % (ref 11.5–15.5)
WBC: 18.3 10*3/uL — ABNORMAL HIGH (ref 4.0–10.5)

## 2014-09-17 MED ORDER — HEPARIN SOD (PORK) LOCK FLUSH 100 UNIT/ML IV SOLN
250.0000 [IU] | INTRAVENOUS | Status: AC | PRN
  Administered 2014-09-17: 250 [IU]

## 2014-09-17 MED ORDER — SODIUM CHLORIDE 0.9 % IV SOLN: 1.0000 g | INTRAVENOUS | Status: AC

## 2014-09-17 MED ORDER — OXYCODONE HCL 5 MG PO TABS
5.0000 mg | ORAL_TABLET | ORAL | Status: DC | PRN
Start: 2014-09-17 — End: 2016-06-04

## 2014-09-17 NOTE — Discharge Summary (Signed)
Physician Discharge Summary  [name] XVQ:[id number] DOB: [birth year] DOA: 09/12/2014  PCP: Allena Katz, MD  Admit date: 09/12/2014 Discharge date: 09/17/2014  Time spent: 45 minutes  Recommendations for Outpatient Follow-up:  -Will be discharged home today. -Follow up with Dr. Johnnye Sima in [facility] in 3 weeks. [facility] RN will arrange home antibiotics: ertapenem 1 gr daily until 10/08/14.   Discharge Diagnoses:  Principal Problem:   Liver abscess Active Problems:   Abdominal abscess   Lesion of liver   Sepsis   Discharge Condition: Stable and improved  Filed Weights   09/12/14 2156 09/13/14 0134  Weight: 71.668 kg (158 lb) 72.1 kg (158 lb 15.2 oz)    History of present illness:  [name] is a 64 y.o. female previously healthy, who presents to the ED with 1 week history of RUQ abdominal pain. Pain is 6/10 in intensity, ultrasound was done on 1/13 demonstrating hypoechoic masses of the liver. Follow up liver MRI was done today as an outpatient which shows multiple peripherally enhancing hepatic lesions which represent either metastatic disease from an unknown primary (none was seen in the abdomen on that same MRI) vs multifocal abscesses which could have a similar appearance.  In the ED patient is running a fever of 101.9, is tachycardic to 120s, and has a WBC of 20k.  Hospital Course:   SIRS/Liver Absecess -Presenting with fever, tachycardia, leukocytosis. -Sepsis parameters have resolved by time of DC. -Source is a  liver abscess. -Placed on IV Zosyn that will be transitioned to ertapenem on DC for 3 weeks with a STOP DATE of 10/08/14. -blood/fungus cxs are negative. -Pathology negative for malignant cells. -Repeat MRI 1 /24 shows decreased size of previous abscess without new abscess formation. -Appreciate ID input and recommendations.   Procedures:  Liver aspiration   Consultations:  IR  ID  Discharge Instructions  Discharge Instructions     Continue PICC at discharge    Complete by:  As directed   Flush PICC line per home health policy:  Yes     Increase activity slowly    Complete by:  As directed             Medication List    STOP taking these medications        traMADol 50 MG tablet  Commonly known as:  ULTRAM      TAKE these medications        ertapenem 1 g in sodium chloride 0.9 % 50 mL  Inject 1 g into the vein daily.     oxyCODONE 5 MG immediate release tablet  Commonly known as:  Oxy IR/ROXICODONE  Take 1 tablet (5 mg total) by mouth every 4 (four) hours as needed for severe pain.       No Known Allergies     Follow-up Information    Follow up with Bobby Rumpf, MD.   Specialty:  Infectious Diseases   Why:  Office will call you with appointment in 3 weeks   Contact information:   [place] [address] [phone]       Follow up with [facility].   Why:  HHRN for iv abx   Contact information:   [address] [place] [address] [phone]        The results of significant diagnostics from this hospitalization (including imaging, microbiology, ancillary and laboratory) are listed below for reference.    Significant Diagnostic Studies:  Mr Abdomen W Wo Contrast  09/12/2014   CLINICAL DATA:  Initial encounter for right upper abdominal and shoulder pain. Ultrasound demonstrating hypoechoic liver lesions. No primary malignancy history submitted.  EXAM: MRI ABDOMEN WITHOUT AND WITH CONTRAST  TECHNIQUE: Multiplanar multisequence MR imaging of the abdomen was performed both before and after the administration of intravenous contrast.  CONTRAST:  14 cc MultiHance  COMPARISON:  09/05/2014 abdominal ultrasound.  FINDINGS: Lower chest: Normal heart size without pericardial or pleural effusion.  Hepatobiliary: No evidence of cirrhosis. Multiple heterogeneous T2 hyperintense, peripherally enhancing lesions are identified throughout both  lobes of the liver. Peripheral enhancing septae identified within the majority of these lesions. Index lesions include the high right hepatic lobe 3.9 x 3.9 cm lesion on image 7 of series 3. Anterior segment left liver lobe subcapsular 4.5 x 3.0 cm lesion on image 9 of series 3. Posterior segment right lobe 3.3 x 2.1 cm lesion on image 19 of series 3.  Minimal intrahepatic biliary ductal dilatation. Normal appearance of the gallbladder. The common duct measures 9 mm, minimally dilated for patient age. No obstructive stone identified. There is a periampullary duodenal diverticulum which may cause a component of common duct dilatation.  Pancreas: Tiny cystic foci within the pancreatic body dorsally. Example image 18 of series 14. 2 foci of measure on the order of 3 mm and likely represent foci of side branch ductectasia. No acute pancreatitis or main branch duct dilatation. No dominant pancreatic mass.  Spleen: Normal  Adrenals/Urinary Tract: Normal adrenal glands. Tiny bilateral renal cysts.  Stomach/Bowel: Normal stomach and abdominal bowel loops.  Vascular/Lymphatic: Normal caliber of the aorta and branch vessels. No retroperitoneal or retrocrural adenopathy.  Other: No ascites.  Musculoskeletal: No acute osseous abnormality.  IMPRESSION: 1. Multiple peripherally enhancing hepatic lesions. Favor metastatic disease from unknown primary (possibilities include colon, breast, thyroid). Multifocal abscesses (pyogenic or even echinococcal) could have a similar appearance. Correlate with infectious symptoms and history of primary malignancy. Consider tissue sampling. 2. No primary malignancy identified within the abdomen. 3. Minimal biliary ductal prominence. No obstructive mass or stone. There is a periampullary duodenal diverticulum which could be the cause. 4. Minimal side branch duct ectasia within the pancreatic body. Of doubtful clinical significance.   Electronically Signed   By: Abigail Miyamoto M.D.   On: 09/12/2014  12:18   US Abdomen Complete  09/05/2014   CLINICAL DATA:  Right upper quadrant pain with extension to the right scapula for the past 2 days  EXAM: ULTRASOUND ABDOMEN COMPLETE  COMPARISON:  None.  FINDINGS: Gallbladder: No gallstones or wall thickening visualized. No sonographic Murphy sign noted.  Common bile duct: Diameter: 6 mm  Liver: The hepatic echotexture is normal. There are numerous hypoechoic nodules demonstrated with the largest lying along the anterior surface of the right lobe measuring 2.7 x 2.2 x 3 cm. There are internal echoes but there is no increased vascularity. There is no intrahepatic ductal dilation.  IVC: Bowel gas limits evaluation of the inferior vena cava.  Pancreas: Bowel gas limits evaluation of the pancreas.  Spleen: Size and appearance within normal limits.  Right Kidney: Length: 10.8 cm. Echogenicity within normal limits. No mass or hydronephrosis visualized.  Left Kidney: Length: 10.7 cm. Echogenicity within normal limits. No mass or hydronephrosis visualized.  Abdominal aorta: No aneurysm visualized.  Other findings: No ascites is demonstrated.  IMPRESSION: 1. There are abnormal hypoechoic masses within the liver of uncertain etiology. These could reflect metastatic or less likely primary malignancy.  They do not reflect simple cysts. Hepatic protocol MRI is recommended. 2. There is no acute abnormality of the gallbladder, common bile duct, or visualized portions of the pancreas. There is no acute abnormality of the kidneys or spleen.   Electronically Signed   By: David  Martinique   On: 09/05/2014 15:08   Mr Liver W Wo Contrast  09/17/2014   CLINICAL DATA:  Re-evaluate liver abscesses.  EXAM: MRI ABDOMEN WITHOUT AND WITH CONTRAST  TECHNIQUE: Multiplanar multisequence MR imaging of the abdomen was performed both before and after the administration of intravenous contrast.  CONTRAST:  88mL MULTIHANCE GADOBENATE DIMEGLUMINE 529 MG/ML IV SOLN  COMPARISON:  09/12/2014  FINDINGS: Numerous  hepatic abscesses are again demonstrated. These have slightly improved since the prior MRI examination. The inflammation surrounding the abscesses has decreased and the abscesses are slightly smaller. The segment 4A lesion previously measured a maximum of 3.5 cm and now measures 3 cm. The segment 8 lesion previously measured 4.1 x 4.0 cm and now measures 3.5 x 3.4 cm. The segment 5 lesion previously measured a maximum of 2.5 cm and now measures 1.6 cm.  No new hepatic abscesses. The gallbladder is contracted. The common bile duct is upper limits of normal in caliber and has a normal course. It measures a maximum of 8 mm in the porta hepatis. No common bile duct stones. No intrahepatic biliary dilatation.  The pancreas is unremarkable and stable. A moderate size duodenum diverticulum is noted near the pancreatic head. The spleen is normal in size. No focal lesions. The adrenal glands and kidneys are unremarkable and stable.  Stable scattered mesenteric and retroperitoneal lymph nodes mainly around the porta hepatis.  IMPRESSION: 1. Improving liver abscesses when compared to the prior study from 09/12/2014. The abscesses are smaller and there is less inflammation in the surrounding hepatic parenchyma. No new abscesses. 2. Stable inflammatory/hyperplastic lymphadenopathy. 3. Small bilateral pleural effusions, right greater than left with right lower lobe atelectasis.   Electronically Signed   By: Kalman Jewels M.D.   On: 09/17/2014 08:23   Ct Image Guided Drainage By Percutaneous Catheter  09/13/2014   CLINICAL DATA:  Multiple liver fluid collections  EXAM: CT IMAGE GUIDED DRAINAGE BY PERCUTANEOUS CATHETER  FLUOROSCOPY TIME:  None  MEDICATIONS AND MEDICAL HISTORY: Versed 1 mg, Fentanyl 50 mcg.  Additional Medications: None.  ANESTHESIA/SEDATION: Moderate sedation time: 5 minutes  CONTRAST:  None  PROCEDURE: The procedure, risks, benefits, and alternatives were explained to the patient. Questions regarding the  procedure were encouraged and answered. The patient understands and consents to the procedure.  The right upper quadrant was prepped with Betadine in a sterile fashion, and a sterile drape was applied covering the operative field. A sterile gown and sterile gloves were used for the procedure.  Under CT guidance, an 18 gauge needle was inserted into a fluid collection in the right lobe of the liver. 15 cc of pus was aspirated. Final imaging was performed. A sample was sent for cytology and culture.  FINDINGS: Images document needle placement in a right lobe liver fluid collection. Post aspiration images demonstrate no hemorrhage.  COMPLICATIONS: None  IMPRESSION: Successful right lobe liver fluid collection aspiration for cytology and culture.   Electronically Signed   By: Maryclare Bean M.D.   On: 09/13/2014 17:04    Microbiology: Recent Results (from the past 240 hour(s))  Blood culture (routine x 2)     Status: None (Preliminary result)   Collection Time: 09/12/14 10:48 PM  Result Value Ref Range Status   Specimen Description BLOOD RIGHT ARM  Final   Special Requests BOTTLES DRAWN AEROBIC AND ANAEROBIC 5CC  Final   Culture   Final           BLOOD CULTURE RECEIVED NO GROWTH TO DATE CULTURE WILL BE HELD FOR 5 DAYS BEFORE ISSUING A FINAL NEGATIVE REPORT Performed at [facility]    Report Status PENDING  Incomplete  Blood culture (routine x 2)     Status: None (Preliminary result)   Collection Time: 09/12/14 10:56 PM  Result Value Ref Range Status   Specimen Description BLOOD LEFT HAND  Final   Special Requests BOTTLES DRAWN AEROBIC AND ANAEROBIC 5CC  Final   Culture   Final           BLOOD CULTURE RECEIVED NO GROWTH TO DATE CULTURE WILL BE HELD FOR 5 DAYS BEFORE ISSUING A FINAL NEGATIVE REPORT Performed at [facility]    Report Status PENDING  Incomplete  Urine culture     Status: None   Collection Time: 09/13/14 12:26 AM  Result Value Ref Range Status   Specimen Description  URINE, RANDOM  Final   Special Requests NONE  Final   Colony Count   Final    25,000 COLONIES/ML Performed at [facility]    Culture   Final    Multiple bacterial morphotypes present, none predominant. Suggest appropriate recollection if clinically indicated. Performed at [facility]    Report Status 09/14/2014 FINAL  Final  Body fluid culture     Status: None   Collection Time: 09/13/14  2:40 PM  Result Value Ref Range Status   Specimen Description FLUID LIVER  Final   Special Requests NONE  Final   Gram Stain   Final    MODERATE WBC PRESENT,BOTH PMN AND MONONUCLEAR NO ORGANISMS SEEN Performed at [facility]    Culture   Final    NO GROWTH 3 DAYS Performed at [facility]    Report Status 09/16/2014 FINAL  Final     Labs: Basic Metabolic Panel:  Recent Labs Lab 09/12/14 2227 09/13/14 0655 09/15/14 0512  NA 131* 139 138  K 3.5 3.5 3.4*  CL 94* 104 104  CO2 30 26 26   GLUCOSE 138* 119* 130*  BUN 5* <5* <5*  CREATININE 0.78 0.70 0.67  CALCIUM 8.2* 8.0* 8.0*   Liver Function Tests:  Recent Labs Lab 09/12/14 2227 09/15/14 0512  AST 57* 35  ALT 49* 37*  ALKPHOS 185* 152*  BILITOT 1.0 0.9  PROT 6.8 5.7*  ALBUMIN 2.7* 2.2*   No results for input(s): LIPASE, AMYLASE in the last 168 hours. No results for input(s): AMMONIA in the last 168 hours. CBC:  Recent Labs Lab 09/12/14 2227 09/13/14 0655 09/15/14 0512 09/16/14 0630 09/17/14 0530  WBC 20.5* 16.7* 18.1* 17.7* 18.3*  NEUTROABS 16.7*  --   --   --  13.8*  HGB 12.3 11.9* 10.8* 10.8* 11.5*  HCT 35.5* 35.3* 32.6* 32.2* 34.7*  MCV 87.2 86.9 88.1 87.0 87.2  PLT 395 333 368 381 421*   Cardiac Enzymes: No results for input(s): CKTOTAL, CKMB, CKMBINDEX, TROPONINI in the last 168 hours. BNP: BNP (last 3 results) No results for input(s): PROBNP in the last 8760 hours. CBG: No results for input(s): GLUCAP in the last 168 hours.     SignedLelon Frohlich  [facility] Pager: [phone] 09/17/2014, 4:39 PM

## 2014-09-17 NOTE — Progress Notes (Signed)
[name] to be D/C'd Home per MD order.  Discussed with the patient and all questions fully answered.    Medication List    STOP taking these medications        traMADol 50 MG tablet  Commonly known as:  ULTRAM      TAKE these medications        ertapenem 1 g in sodium chloride 0.9 % 50 mL  Inject 1 g into the vein daily.     oxyCODONE 5 MG immediate release tablet  Commonly known as:  Oxy IR/ROXICODONE  Take 1 tablet (5 mg total) by mouth every 4 (four) hours as needed for severe pain.        VVS, Skin clean, dry and intact without evidence of skin break down, no evidence of skin tears noted. IV catheter discontinued intact. Site without signs and symptoms of complications. Dressing and pressure applied.  An After Visit Summary was printed and given to the patient.  D/c education completed with patient/family including follow up instructions, medication list, d/c activities limitations if indicated, with other d/c instructions as indicated by MD - patient able to verbalize understanding, all questions fully answered.   Patient instructed to return to ED, call 911, or call MD for any changes in condition.   Patient escorted via [place], and D/C home via private auto.  Riddik Senna D 09/17/2014 3:39 PM

## 2014-09-17 NOTE — Care Management Note (Signed)
    Page 1 of 1   09/17/2014     1:39:33 PM CARE MANAGEMENT NOTE 09/17/2014  Patient:  [name], [name]   Account Number:  [id number]  Date Initiated:  09/17/2014  Documentation initiated by:  Tomi Bamberger  Subjective/Objective Assessment:   dx abd abscess  admit- lives with spouse.     Action/Plan:   will need home iv abx.   Anticipated DC Date:  09/17/2014   Anticipated DC Plan:  [place]  CM consult      [facility] Choice  HOME HEALTH   Choice offered to / List presented to:  C-1 Patient        [place] arranged  IV Antibiotics  HH-1 RN      [place].   Status of service:  Completed, signed off Medicare Important Message given?  NO (If response is "NO", the following Medicare IM given date fields will be blank) Date Medicare IM given:   Medicare IM given by:   Date Additional Medicare IM given:   Additional Medicare IM given by:    Discharge Disposition:  [place]  Per UR Regulation:  Reviewed for med. necessity/level of care/duration of stay  If discussed at [place] of Stay Meetings, dates discussed:    Comments:  09/17/14 [place], BSN [phone] patient lives with spouse, she chose [facility] for hh rn for iv abx, referral made to [facility], [place] notified , Jeannene Patella will come up to speak with patient and spouse.  Soc will begin 24-48 hrs post dc.

## 2014-09-17 NOTE — Progress Notes (Signed)
  Outpatient Parenteral Antimicrobial Therapy (OPAT) Pharmacy Monitoring  Guidelines developed by the Infectious Diseases Society of [place] (IDSA) recommend that hospitals who send patients home on intravenous (IV) antibiotics have an active performance improvement program.  IDSA guidelines also recommend that an infection specialist, such as a pharmacist, be involved in the evaluation of candidates for discharge with OPAT.      Yes No Comments  Assessed patient for appropriateness of OPAT? [x]  []  Is IV antimicrobial therapy needed? Is the home environment safe and adequate to support care? Are the patient/caregivers willing to participate and able to safely, effectively, and reliably deliver parenteral antimicrobial therapy? Is communication about problems and monitoring of therapy in place with patient? Does the patient/caregiver understand the benefits/risks with OPAT?  Are oral antibiotics an option? []  [x]  Patients diagnosed with endocarditis are not candidates for oral antibiotics.  Is antibiotic selected the best option for treatment indication? [x]  []  Can therapy be de-escalated further?  Is the dose appropriate? [x]  []  Consider renal function and administration schedule for outpatient setting.  Is the length of therapy appropriate? [x]  []  Consider disease-specific guidelines when available.  Recommend changing antibiotic regimen? []  [x]  If yes, recommend _________.    Final antibiotic regimen for discharge is Ertapenem 1 gm IV daily for 3 weeks (unil 10/08/14) then follow-up in ID clinic (include drug, dose, frequency and duration). Laboratory monitoring and follow-up for discharge with OPAT is follow-up with ID in the next 3 weeks.   Thank you for allowing pharmacy to be a part of this patient's care team.  Mikeyla Music L. Nicole Kindred, PharmD Clinical Pharmacy Resident Pager: [phone] 09/17/2014 4:03 PM

## 2014-09-19 LAB — CULTURE, BLOOD (ROUTINE X 2)
Culture: NO GROWTH
Culture: NO GROWTH

## 2014-09-19 LAB — ECHINOCOCCUS ANTIBODY, IGG

## 2014-10-08 ENCOUNTER — Inpatient Hospital Stay: Payer: BLUE CROSS/BLUE SHIELD | Admitting: Infectious Diseases

## 2014-10-10 ENCOUNTER — Encounter: Payer: Self-pay | Admitting: Infectious Diseases

## 2014-10-10 ENCOUNTER — Ambulatory Visit (INDEPENDENT_AMBULATORY_CARE_PROVIDER_SITE_OTHER): Payer: BLUE CROSS/BLUE SHIELD | Admitting: Infectious Diseases

## 2014-10-10 DIAGNOSIS — K750 Abscess of liver: Secondary | ICD-10-CM | POA: Diagnosis not present

## 2014-10-10 NOTE — Assessment & Plan Note (Signed)
We discussed getting a repeat MRI on her to eval for resolution of her abscesses. I will have her seen by GI to comment on this as well as to get a colonoscopy. This would be the next step in evaluation for a source of her abscess (colitis, diverticulitis, colon CA).  She has been seen at [facility] before.  Will see her back prn.

## 2014-10-10 NOTE — Progress Notes (Signed)
   Subjective:    Patient ID: [name], female    DOB: Feb 21, 1951, 64 y.o.   MRN: [id number]  HPI 64 y.o. F with no pmhx adm 09-13-14 with 2 weeks of abdominal pain. She was seen by PCP and had RUQ u/s on 1/13 that showed numerous hypoechogenic nodules, then MRI on 1/20 showing multiple peripherally enhancing hepatic lesions concerning for abscess rather than malignancy. On adm Blood cx (-), IR aspiration on 1/22 (-). She was empirically started on piptazo then changed to invanz at d/c. Her hospital course was notable for continued abd pain, she had a repeat MRI: 1. Improving liver abscesses when compared to the prior study from 09/12/2014. The abscesses are smaller and there is less inflammation in the surrounding hepatic parenchyma. No new abscesses. 2. Stable inflammatory/hyperplastic lymphadenopathy. 3. Small bilateral pleural effusions, right greater than left with right lower lobe atelectasis. She was d/c home on 21 days of Invanz, completed yesterday. PIC pulled yesterday.  No fever or chills, no abd pain.   No problems with anbx, no loose BM.  Has had previous colonoscopy, states she is due for repeat this year.   Review of Systems  Constitutional: Negative for fever, chills, appetite change and unexpected weight change.  Gastrointestinal: Negative for diarrhea and constipation.  Genitourinary: Negative for difficulty urinating.      Objective:   Physical Exam  Constitutional: She appears well-developed and well-nourished.  HENT:  Mouth/Throat: No oropharyngeal exudate.  Eyes: EOM are normal. Pupils are equal, round, and reactive to light.  Neck: Neck supple.  Cardiovascular: Normal rate, regular rhythm and normal heart sounds.   Pulmonary/Chest: Effort normal and breath sounds normal.  Abdominal: Soft. Bowel sounds are normal. She exhibits no distension. There is no tenderness. There is no rebound.  Musculoskeletal: She exhibits no edema.       Arms: Lymphadenopathy:   She has no cervical adenopathy.      Assessment & Plan:

## 2014-10-15 ENCOUNTER — Telehealth: Payer: Self-pay | Admitting: *Deleted

## 2014-10-15 NOTE — Telephone Encounter (Signed)
Patient given appointment at [facility] 3/8 9:00 for follow up of liver abscess.  Pt accepted appointment, given contact information.  Office note, labs, and return-to-work letter faxed to [facility] [phone]. Landis Gandy, RN

## 2014-11-19 LAB — HM COLONOSCOPY

## 2015-01-14 ENCOUNTER — Encounter: Payer: Self-pay | Admitting: Infectious Diseases

## 2015-04-26 ENCOUNTER — Other Ambulatory Visit: Payer: Self-pay | Admitting: Obstetrics and Gynecology

## 2015-04-30 LAB — CYTOLOGY - PAP

## 2016-05-17 ENCOUNTER — Telehealth: Payer: Self-pay | Admitting: Family Medicine

## 2016-05-17 NOTE — Telephone Encounter (Signed)
Received records from [place]- will abstract

## 2016-05-21 ENCOUNTER — Other Ambulatory Visit: Payer: Self-pay | Admitting: Family Medicine

## 2016-06-03 ENCOUNTER — Ambulatory Visit: Payer: BLUE CROSS/BLUE SHIELD | Admitting: Family Medicine

## 2016-06-04 ENCOUNTER — Encounter: Payer: Self-pay | Admitting: Family Medicine

## 2016-06-04 ENCOUNTER — Ambulatory Visit (INDEPENDENT_AMBULATORY_CARE_PROVIDER_SITE_OTHER): Payer: BLUE CROSS/BLUE SHIELD | Admitting: Family Medicine

## 2016-06-04 VITALS — BP 128/77 | HR 71 | Temp 98.2°F | Ht 66.5 in | Wt 164.8 lb

## 2016-06-04 DIAGNOSIS — Z634 Disappearance and death of family member: Secondary | ICD-10-CM | POA: Diagnosis not present

## 2016-06-04 DIAGNOSIS — F4321 Adjustment disorder with depressed mood: Secondary | ICD-10-CM | POA: Diagnosis not present

## 2016-06-04 DIAGNOSIS — K750 Abscess of liver: Secondary | ICD-10-CM

## 2016-06-04 DIAGNOSIS — Z0000 Encounter for general adult medical examination without abnormal findings: Secondary | ICD-10-CM

## 2016-06-04 NOTE — Patient Instructions (Signed)
It was very nice to meet you today and I look forward to working with you in the future as well.   We are glad to see you back in a few months for labs and a check-up I am so very sorry to hear of the loss of your son [name].  If there is anything I can do to be of help to you please don't hesitate to let me know.  As we discussed briefly, the compassionate friends network may be helpful for you- they are a group of people who have also suffered child loss and can offer support and understanding.

## 2016-06-04 NOTE — Progress Notes (Signed)
[place] at [facility] [address], [place], [place] [address] [phone]  Date:  06/04/2016   Name:  [name]   DOB:  September 03, 1950   MRN:  [id number]  PCP:  Lamar Blinks, MD    Chief Complaint: Establish Care (Pt here to est care.Declined flu vaccine. )   History of Present Illness:  [name] is a 65 y.o. very pleasant female patient who presents with the following:  Here today as a new patient to establish care.  She has been generally healthy except for an admission last January for about one week with a liver abscess- she was treated with ABX and recovered to her normal state of health She did see ID for follow-up - they referred her to [facility] to get a follow-up MRI of her liver and a colonoscopy.   She underwent colonoscopy in March of 2016.   This was normal. She does not think she had a follow-up MRI however.   Tragically her son did pass away about 10 days ago.  [name], 34, committed suicide while staying with his dad.  He had suffered from depression for some time. [name] feels comforted by her knowledge that her son is now with the lord and states that she is ok, has no intent on self harm and declines any medication to help with sleep.  She lists her church as her support system.  Discussed the compassionate friends network and printed out local contact info for her, encouraged her to reach out to them when she feels ready.  Reminded her that we are here for her if she needs anything.  For the time being she is processing her grief on her own but will let me know if I can help in any way  She sees OBG- Dr. Gaetano Net.  She recently had a CPE with him.   She has not had labs in some time.  She would like to contact her insurance company to find out what they may cover prior to having any tests done today  She works 2nd shift in [employer], [employer].  She returned to work this week.   She is a non smoker, does not drink  alcohol Patient Active Problem List   Diagnosis Date Noted  . Liver abscess 09/13/2014  . Lesion of liver 09/13/2014  . Sepsis ([place]) 09/13/2014    No past medical history on file.  Past Surgical History:  Procedure Laterality Date  . BLADDER SURGERY      Social History  Substance Use Topics  . Smoking status: Never Smoker  . Smokeless tobacco: Never Used  . Alcohol use No    Family History  Problem Relation Age of Onset  . Healthy Mother   . Healthy Father     No Known Allergies  Medication list has been reviewed and updated.  Current Outpatient Prescriptions on File Prior to Visit  Medication Sig Dispense Refill  . calcium-vitamin D 250-100 MG-UNIT per tablet Take 1 tablet by mouth 2 (two) times daily.    Marland Kitchen estradiol (VIVELLE-DOT) 0.05 MG/24HR patch Place 1 patch onto the skin 2 (two) times a week.    . Multiple Vitamins-Minerals (MULTIVITAMIN WITH MINERALS) tablet Take 1 tablet by mouth daily.    Marland Kitchen oxyCODONE (OXY IR/ROXICODONE) 5 MG immediate release tablet Take 1 tablet (5 mg total) by mouth every 4 (four) hours as needed for severe pain. (Patient not taking: Reported on 10/10/2014) 30 tablet 0  .  VAGIFEM 10 MCG TABS vaginal tablet      No current facility-administered medications on file prior to visit.     Review of Systems:  As per HPI- otherwise negative.   Physical Examination: Vitals:   06/04/16 0948  BP: (!) 144/80  Pulse: 71  Temp: 98.2 F (36.8 C)   Vitals:   06/04/16 0948  Weight: 164 lb 12.8 oz (74.8 kg)  Height: 5' 6.5" (1.689 m)   Body mass index is 26.2 kg/m. Ideal Body Weight: Weight in (lb) to have BMI = 25: 156.9  GEN: WDWN, NAD, Non-toxic, A & O x 3, somewhat tearful during discussion HEENT: Atraumatic, Normocephalic. Neck supple. No masses, No LAD. Ears and Nose: No external deformity. CV: RRR, No M/G/R. No JVD. No thrill. No extra heart sounds. PULM: CTA B, no wheezes, crackles, rhonchi. No retractions. No resp. distress. No  accessory muscle use. EXTR: No c/c/e NEURO Normal gait.  PSYCH: Normally interactive. Conversant. Not depressed or anxious appearing.  Calm demeanor.    Assessment and Plan: Grief at loss of child  Encounter for medical examination to establish care  Abscess, liver  Here today to establish care She declines flu, tetanus and pneumonia vaccines, prefers to defer labs today Discussed the very recent death of her son [name] and offered support to her.   Plan to follow-up soon  Signed Lamar Blinks, MD

## 2016-06-04 NOTE — Progress Notes (Signed)
Pre visit review using our clinic review tool, if applicable. No additional management support is needed unless otherwise documented below in the visit note. 

## 2016-06-09 ENCOUNTER — Telehealth: Payer: Self-pay | Admitting: Family Medicine

## 2016-06-09 DIAGNOSIS — Z130 Encounter for screening for diseases of the blood and blood-forming organs and certain disorders involving the immune mechanism: Secondary | ICD-10-CM

## 2016-06-09 DIAGNOSIS — Z131 Encounter for screening for diabetes mellitus: Secondary | ICD-10-CM

## 2016-06-09 DIAGNOSIS — Z13220 Encounter for screening for lipoid disorders: Secondary | ICD-10-CM

## 2016-06-09 NOTE — Telephone Encounter (Signed)
Relation to [id number]  Call back number:[phone]   Reason for call:  Patient checked with insurance company inquiring about what labs are covered patient states; cholesterol and sugar labs are covered and if any other labs are needed there is a "special code" insurance is requiring. Patient is unaware of what "special code" insurance is referring too. Patient requesting lab orders. Please advise

## 2016-06-11 NOTE — Telephone Encounter (Signed)
Do you know of any lab orders this pt may need? Please advise.

## 2016-06-12 NOTE — Telephone Encounter (Signed)
Tried to contact pt to discuss provider recommendations. No answer, phone rang for a long time and instead of voicemail there was a tone that sounds like a fax machine.

## 2016-06-12 NOTE — Telephone Encounter (Signed)
I ordered a CBC, CMP and lipids for her.  These would generally be covered by insurance. Please give her a call and she can make a lab appt (fasting please)- thank you

## 2016-06-18 NOTE — Telephone Encounter (Signed)
Patient called to let us know that her home phone is not working currently, she wanted to see if anyone form the office had called her regarding this. She would like a return call on her cell phone listed below.   Phone: [phone] (can leave message)

## 2016-06-19 NOTE — Telephone Encounter (Signed)
Called and LVM making patient aware that Dr. Darla Lesches would like her to schedule appt for lab work.

## 2016-06-19 NOTE — Telephone Encounter (Signed)
See note below from Dr. Lorelei Pont. Pt can schedule lab appt at her convenience.

## 2017-02-19 ENCOUNTER — Telehealth: Payer: Self-pay | Admitting: Family Medicine

## 2017-02-19 DIAGNOSIS — Z1239 Encounter for other screening for malignant neoplasm of breast: Secondary | ICD-10-CM

## 2017-02-19 NOTE — Telephone Encounter (Signed)
Please give pt a call- I have ordered mammogram. She can call the imaging dept downstairs and make an appt at her convenience. Ok to leave message with this info

## 2017-02-19 NOTE — Telephone Encounter (Signed)
Pt informed

## 2017-02-19 NOTE — Telephone Encounter (Signed)
Caller name: Relation to GI:[id number] Call back number: [phone] Pharmacy:  Reason for call:  Pt is requesting a referral to have her mammogram done downstairs imaging, pt has bcbs. Please call.

## 2017-02-23 ENCOUNTER — Telehealth: Payer: Self-pay | Admitting: Family Medicine

## 2017-02-23 NOTE — Telephone Encounter (Signed)
ROI faxed to [facility]   °

## 2017-02-25 NOTE — Telephone Encounter (Signed)
Received request for Medical records from [place] that was addressed to [facility], along with the release request for medical records from [facility] to be sent to our office; which both appear to be forwarded from [place] [phone], forwarded to [place] for clarification/SLS 07/05

## 2017-03-09 ENCOUNTER — Telehealth: Payer: Self-pay | Admitting: *Deleted

## 2017-03-09 NOTE — Telephone Encounter (Signed)
Received Medical records from [facility], forwarded to provider/SLS 0/17

## 2017-03-10 ENCOUNTER — Telehealth: Payer: Self-pay | Admitting: Family Medicine

## 2017-03-10 DIAGNOSIS — M8580 Other specified disorders of bone density and structure, unspecified site: Secondary | ICD-10-CM

## 2017-03-10 NOTE — Telephone Encounter (Signed)
Received records from [facility]

## 2017-03-22 ENCOUNTER — Ambulatory Visit (HOSPITAL_BASED_OUTPATIENT_CLINIC_OR_DEPARTMENT_OTHER)
Admission: RE | Admit: 2017-03-22 | Discharge: 2017-03-22 | Disposition: A | Discharge status: Home / Self Care | Payer: BLUE CROSS/BLUE SHIELD | Source: Ambulatory Visit | Attending: Family Medicine | Admitting: Family Medicine

## 2017-03-22 DIAGNOSIS — Z1231 Encounter for screening mammogram for malignant neoplasm of breast: Secondary | ICD-10-CM | POA: Diagnosis not present

## 2017-03-22 DIAGNOSIS — Z1239 Encounter for other screening for malignant neoplasm of breast: Secondary | ICD-10-CM

## 2017-03-24 ENCOUNTER — Other Ambulatory Visit: Payer: Self-pay | Admitting: Family Medicine

## 2017-03-24 DIAGNOSIS — R928 Other abnormal and inconclusive findings on diagnostic imaging of breast: Secondary | ICD-10-CM

## 2017-03-30 ENCOUNTER — Ambulatory Visit
Admission: RE | Admit: 2017-03-30 | Discharge: 2017-03-30 | Disposition: A | Discharge status: Home / Self Care | Payer: BLUE CROSS/BLUE SHIELD | Source: Ambulatory Visit | Attending: Family Medicine | Admitting: Family Medicine

## 2017-03-30 DIAGNOSIS — R928 Other abnormal and inconclusive findings on diagnostic imaging of breast: Secondary | ICD-10-CM

## 2017-05-12 ENCOUNTER — Telehealth: Payer: Self-pay | Admitting: Family Medicine

## 2017-05-12 NOTE — Telephone Encounter (Signed)
Pt would like to know if medical records have been received from Dr. Maree Erie office?   Please advise

## 2017-05-14 NOTE — Telephone Encounter (Signed)
Have not received as of yet/SLS 09/21

## 2017-05-21 NOTE — Progress Notes (Addendum)
[place] at [facility] [address], [place], [place] [address] [phone]  Date:  05/24/2017   Name:  [name]   DOB:  02/24/1951   MRN:  [id number]  PCP:  Darreld Mclean, MD    Chief Complaint: Annual Exam (Pt here for CPE with PAP and fasting labs. Declined ful vaccine. )   History of Present Illness:  [name] is a 66 y.o. very pleasant female patient who presents with the following:  Here today for a CPE History of liver abscess in 2016 I last saw her in to establish care in October.    Here today as a new patient to establish care.  She has been generally healthy except for an admission last January for about one week with a liver abscess- she was treated with ABX and recovered to her normal state of health She did see ID for follow-up - they referred her to [facility] to get a follow-up MRI of her liver and a colonoscopy.   She underwent colonoscopy in March of 2016.   This was normal. She does not think she had a follow-up MRI however.   Tragically her son did pass away about 10 days ago.  Charlie, 34, committed suicide while staying with his dad.  He had suffered from depression for some time. [name] feels comforted by her knowledge that her son is now with the lord and states that she is ok, has no intent on self harm and declines any medication to help with sleep.  She lists her church as her support system.  Discussed the compassionate friends network and printed out local contact info for her, encouraged her to reach out to them when she feels ready.  Reminded her that we are here for her if she needs anything.  For the time being she is processing her grief on her own but will let me know if I can help in any way  She sees OBG- Dr. Gaetano Net.  She recently had a CPE with him.   She has not had labs in some time.  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  She returned to work this week.   She is a non smoker, does not drink alcohol  Flu shot: declines Pneumonia vaccine: declines Pap:she is not quite sure, but never had an abnl that she can recall.  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  She is looking forward to retirement  She is still on her vivelle dot and also uses her vagifem 1-2x a week She did have an oophorectomy years ago when she had a bladder tack operation- otherwise never had any vaginal surgery She has been on the vivelle patch for 15 years or so.   Would like to stay on this until she retires as her hot flashes were so bad without it No post-menopausal bleeding  Today is actually the 1 year anniversary of the death of her son [name].  She feels that she is doing ok today Patient Active Problem List   Diagnosis Date Noted  . Grief at loss of child 05/24/2017  . Osteopenia 03/10/2017  .  Liver abscess 09/13/2014  . Lesion of liver 09/13/2014  . Sepsis ([place]) 09/13/2014    No past medical history on file.  Past Surgical History:  Procedure Laterality Date  . BLADDER SURGERY      Social History  Substance Use Topics  . Smoking status: Never Smoker  . Smokeless tobacco: Never Used  . Alcohol use No    Family History  Problem Relation Age of Onset  . Healthy Mother   . Healthy Father     No Known Allergies  Medication list has been reviewed and updated.  Current Outpatient Prescriptions on File Prior to Visit  Medication Sig Dispense Refill  . calcium-vitamin D 250-100 MG-UNIT per tablet Take 1 tablet by mouth 2 (two) times daily.    . Multiple Vitamins-Minerals (MULTIVITAMIN WITH MINERALS) tablet Take 1 tablet by mouth daily.     No current facility-administered medications on file prior to visit.      Review of Systems:  As per HPI- otherwise negative. No fever or chills No nausea, vomiting or diarrhea    Physical Examination: Vitals:   05/24/17 0915  BP: 130/84  Pulse: 82  Temp: 97.8 F (36.6 C)  SpO2: 98%   Vitals:   05/24/17 0915  Weight: 161 lb 3.2 oz (73.1 kg)  Height: 5' 6.5" (1.689 m)   Body mass index is 25.63 kg/m. Ideal Body Weight: Weight in (lb) to have BMI = 25: 156.9  GEN: WDWN, NAD, Non-toxic, A & O x 3, normal weight, looks well HEENT: Atraumatic, Normocephalic. Neck supple. No masses, No LAD.  Bilateral TM wnl, oropharynx normal.  PEERL,EOMI.   Ears and Nose: No external deformity. CV: RRR, No M/G/R. No JVD. No thrill. No extra heart sounds. PULM: CTA B, no wheezes, crackles, rhonchi. No retractions. No resp. distress. No accessory muscle use. ABD: S, NT, ND. No rebound. No HSM. EXTR: No c/c/e NEURO Normal gait.  PSYCH: Normally interactive. Conversant. Not depressed or anxious appearing.  Calm demeanor.  Breast: normal exam, no masses/ dimpling/ discharge Pelvic: normal external exam, no vaginal discharge. Uterus normal, no CMT, no adnexal tendereness or masses On the anterior wall of the vagina is a whitish, adherent mass or lesion, about 1cm by 1.5 cm in size.  Not able to removed with cotton swab. Does not bleed and is not tender  Assessment and Plan: Physical exam  Grief at loss of child  Screening for deficiency anemia - Plan: CBC  Screening for hyperlipidemia - Plan: Lipid panel  Screening for diabetes mellitus - Plan: Comprehensive metabolic panel, Hemoglobin A1c  Menopausal symptoms - Plan: estradiol (VIVELLE-DOT) 0.05 MG/24HR patch, VAGIFEM 10 MCG TABS vaginal tablet  Screening for cervical cancer - Plan: Cytology - PAP  Vaginal lesion - Plan: Ambulatory referral to Obstetrics / Gynecology  CPE today Labs pending as above Refilled her HRT- discussed my concerns about long term use of this medication, but for the time being  she prefers to stay on it.  However will change her to vagifem tablets in order to better control the dose of estrogen Pap pending Noted vaginal lesion- referral to OBG to examine, she may need a bx if lesion is still present  Will plan further follow- up pending labs. Offered my support and condolences regarding the loss of her son a year ago  Signed Lamar Blinks, MD  Letter to pt with lab results 10/2  Results for orders placed or performed in visit on 05/24/17  CBC  Result Value Ref Range  WBC 6.6 4.0 - 10.5 K/uL   RBC 4.91 3.87 - 5.11 Mil/uL   Platelets 169.0 150.0 - 400.0 K/uL   Hemoglobin 15.0 12.0 - 15.0 g/dL   HCT 44.7 36.0 - 46.0 %   MCV 91.0 78.0 - 100.0 fl   MCHC 33.5 30.0 - 36.0 g/dL   RDW 13.2 11.5 - 15.5 %  Comprehensive metabolic panel  Result Value Ref Range   Sodium 139 135 - 145 mEq/L   Potassium 4.1 3.5 - 5.1 mEq/L   Chloride 103 96 - 112 mEq/L   CO2 28 19 - 32 mEq/L   Glucose, Bld 101 (H) 70 - 99 mg/dL   BUN 11 6 - 23 mg/dL   Creatinine, Ser 0.81 0.40 - 1.20 mg/dL   Total Bilirubin 1.2 0.2 - 1.2 mg/dL   Alkaline Phosphatase 56 39 - 117 U/L   AST 19 0 - 37 U/L   ALT 14 0 - 35 U/L   Total Protein 6.5 6.0 - 8.3 g/dL   Albumin 3.9 3.5 - 5.2 g/dL   Calcium 9.0 8.4 - 10.5 mg/dL   GFR 74.99 >60.00 mL/min  Lipid panel  Result Value Ref Range   Cholesterol 160 0 - 200 mg/dL   Triglycerides 114.0 0.0 - 149.0 mg/dL   HDL 50.90 >39.00 mg/dL   VLDL 22.8 0.0 - 40.0 mg/dL   LDL Cholesterol 86 0 - 99 mg/dL   Total CHOL/HDL Ratio 3    NonHDL 109.05   Hemoglobin A1c  Result Value Ref Range   Hgb A1c MFr Bld 5.6 4.6 - 6.5 %  Cytology - PAP  Result Value Ref Range   Adequacy      Satisfactory for evaluation  endocervical/transformation zone component ABSENT.   Diagnosis      NEGATIVE FOR INTRAEPITHELIAL LESIONS OR MALIGNANCY.   Diagnosis      FUNGAL ORGANISMS PRESENT CONSISTENT WITH CANDIDA SPP.   HPV NOT DETECTED    Material Submitted CervicoVaginal  Pap [ThinPrep Imaged]

## 2017-05-24 ENCOUNTER — Other Ambulatory Visit (HOSPITAL_COMMUNITY)
Admission: RE | Admit: 2017-05-24 | Discharge: 2017-05-24 | Disposition: A | Discharge status: Home / Self Care | Payer: BLUE CROSS/BLUE SHIELD | Source: Ambulatory Visit | Attending: Family Medicine | Admitting: Family Medicine

## 2017-05-24 ENCOUNTER — Other Ambulatory Visit: Payer: Self-pay | Admitting: Emergency Medicine

## 2017-05-24 ENCOUNTER — Ambulatory Visit (INDEPENDENT_AMBULATORY_CARE_PROVIDER_SITE_OTHER): Payer: BLUE CROSS/BLUE SHIELD | Admitting: Family Medicine

## 2017-05-24 VITALS — BP 130/84 | HR 82 | Temp 97.8°F | Ht 66.5 in | Wt 161.2 lb

## 2017-05-24 DIAGNOSIS — N951 Menopausal and female climacteric states: Secondary | ICD-10-CM

## 2017-05-24 DIAGNOSIS — Z131 Encounter for screening for diabetes mellitus: Secondary | ICD-10-CM | POA: Diagnosis not present

## 2017-05-24 DIAGNOSIS — Z130 Encounter for screening for diseases of the blood and blood-forming organs and certain disorders involving the immune mechanism: Secondary | ICD-10-CM | POA: Insufficient documentation

## 2017-05-24 DIAGNOSIS — Z13220 Encounter for screening for lipoid disorders: Secondary | ICD-10-CM | POA: Insufficient documentation

## 2017-05-24 DIAGNOSIS — F4321 Adjustment disorder with depressed mood: Secondary | ICD-10-CM | POA: Insufficient documentation

## 2017-05-24 DIAGNOSIS — Z634 Disappearance and death of family member: Secondary | ICD-10-CM | POA: Diagnosis not present

## 2017-05-24 DIAGNOSIS — Z124 Encounter for screening for malignant neoplasm of cervix: Secondary | ICD-10-CM | POA: Diagnosis not present

## 2017-05-24 DIAGNOSIS — Z0000 Encounter for general adult medical examination without abnormal findings: Secondary | ICD-10-CM

## 2017-05-24 DIAGNOSIS — N898 Other specified noninflammatory disorders of vagina: Secondary | ICD-10-CM | POA: Diagnosis not present

## 2017-05-24 LAB — COMPREHENSIVE METABOLIC PANEL
ALBUMIN: 3.9 g/dL (ref 3.5–5.2)
ALT: 14 U/L (ref 0–35)
AST: 19 U/L (ref 0–37)
Alkaline Phosphatase: 56 U/L (ref 39–117)
BUN: 11 mg/dL (ref 6–23)
CALCIUM: 9 mg/dL (ref 8.4–10.5)
CHLORIDE: 103 meq/L (ref 96–112)
CO2: 28 mEq/L (ref 19–32)
CREATININE: 0.81 mg/dL (ref 0.40–1.20)
GFR: 74.99 mL/min (ref >60.00)
Glucose, Bld: 101 mg/dL — ABNORMAL HIGH (ref 70–99)
Potassium: 4.1 mEq/L (ref 3.5–5.1)
SODIUM: 139 meq/L (ref 135–145)
Total Bilirubin: 1.2 mg/dL (ref 0.2–1.2)
Total Protein: 6.5 g/dL (ref 6.0–8.3)

## 2017-05-24 LAB — LIPID PANEL
CHOL/HDL RATIO: 3
CHOLESTEROL: 160 mg/dL (ref 0–200)
HDL: 50.9 mg/dL (ref >39.00)
LDL CALC: 86 mg/dL (ref 0–99)
NonHDL: 109.05
TRIGLYCERIDES: 114 mg/dL (ref 0.0–149.0)
VLDL: 22.8 mg/dL (ref 0.0–40.0)

## 2017-05-24 LAB — CBC
HCT: 44.7 % (ref 36.0–46.0)
Hemoglobin: 15 g/dL (ref 12.0–15.0)
MCHC: 33.5 g/dL (ref 30.0–36.0)
MCV: 91 fl (ref 78.0–100.0)
PLATELETS: 169 10*3/uL (ref 150.0–400.0)
RBC: 4.91 Mil/uL (ref 3.87–5.11)
RDW: 13.2 % (ref 11.5–15.5)
WBC: 6.6 10*3/uL (ref 4.0–10.5)

## 2017-05-24 LAB — HEMOGLOBIN A1C: HEMOGLOBIN A1C: 5.6 % (ref 4.6–6.5)

## 2017-05-24 MED ORDER — ESTRADIOL 0.05 MG/24HR TD PTTW: 1.0000 | MEDICATED_PATCH | TRANSDERMAL | 3 refills | Status: DC

## 2017-05-24 MED ORDER — VAGIFEM 10 MCG VA TABS: ORAL_TABLET | VAGINAL | 3 refills | Status: DC

## 2017-05-24 NOTE — Patient Instructions (Signed)
It was good to see you again today! I will be in touch with your labs and pap asap  Please let me know if you do not hear about your GYN appointment in the next week or so.  We will ask OBG to take a look at the area I noticed in your vagina and make sure it is ok   Health Maintenance for Postmenopausal Women Menopause is a normal process in which your reproductive ability comes to an end. This process happens gradually over a span of months to years, usually between the ages of 79 and 54. Menopause is complete when you have missed 12 consecutive menstrual periods. It is important to talk with your health care provider about some of the most common conditions that affect postmenopausal women, such as heart disease, cancer, and bone loss (osteoporosis). Adopting a healthy lifestyle and getting preventive care can help to promote your health and wellness. Those actions can also lower your chances of developing some of these common conditions. What should I know about menopause? During menopause, you may experience a number of symptoms, such as:  Moderate-to-severe hot flashes.  Night sweats.  Decrease in sex drive.  Mood swings.  Headaches.  Tiredness.  Irritability.  Memory problems.  Insomnia.  Choosing to treat or not to treat menopausal changes is an individual decision that you make with your health care provider. What should I know about hormone replacement therapy and supplements? Hormone therapy products are effective for treating symptoms that are associated with menopause, such as hot flashes and night sweats. Hormone replacement carries certain risks, especially as you become older. If you are thinking about using estrogen or estrogen with progestin treatments, discuss the benefits and risks with your health care provider. What should I know about heart disease and stroke? Heart disease, heart attack, and stroke become more likely as you age. This may be due, in part, to the  hormonal changes that your body experiences during menopause. These can affect how your body processes dietary fats, triglycerides, and cholesterol. Heart attack and stroke are both medical emergencies. There are many things that you can do to help prevent heart disease and stroke:  Have your blood pressure checked at least every 1-2 years. High blood pressure causes heart disease and increases the risk of stroke.  If you are 70-72 years old, ask your health care provider if you should take aspirin to prevent a heart attack or a stroke.  Do not use any tobacco products, including cigarettes, chewing tobacco, or electronic cigarettes. If you need help quitting, ask your health care provider.  It is important to eat a healthy diet and maintain a healthy weight. ? Be sure to include plenty of vegetables, fruits, low-fat dairy products, and lean protein. ? Avoid eating foods that are high in solid fats, added sugars, or salt (sodium).  Get regular exercise. This is one of the most important things that you can do for your health. ? Try to exercise for at least 150 minutes each week. The type of exercise that you do should increase your heart rate and make you sweat. This is known as moderate-intensity exercise. ? Try to do strengthening exercises at least twice each week. Do these in addition to the moderate-intensity exercise.  Know your numbers.Ask your health care provider to check your cholesterol and your blood glucose. Continue to have your blood tested as directed by your health care provider.  What should I know about cancer screening? There are several  types of cancer. Take the following steps to reduce your risk and to catch any cancer development as early as possible. Breast Cancer  Practice breast self-awareness. ? This means understanding how your breasts normally appear and feel. ? It also means doing regular breast self-exams. Let your health care provider know about any changes,  no matter how small.  If you are 28 or older, have a clinician do a breast exam (clinical breast exam or CBE) every year. Depending on your age, family history, and medical history, it may be recommended that you also have a yearly breast X-ray (mammogram).  If you have a family history of breast cancer, talk with your health care provider about genetic screening.  If you are at high risk for breast cancer, talk with your health care provider about having an MRI and a mammogram every year.  Breast cancer (BRCA) gene test is recommended for women who have family members with BRCA-related cancers. Results of the assessment will determine the need for genetic counseling and BRCA1 and for BRCA2 testing. BRCA-related cancers include these types: ? Breast. This occurs in males or females. ? Ovarian. ? Tubal. This may also be called fallopian tube cancer. ? Cancer of the abdominal or pelvic lining (peritoneal cancer). ? Prostate. ? Pancreatic.  Cervical, Uterine, and Ovarian Cancer Your health care provider may recommend that you be screened regularly for cancer of the pelvic organs. These include your ovaries, uterus, and vagina. This screening involves a pelvic exam, which includes checking for microscopic changes to the surface of your cervix (Pap test).  For women ages 21-65, health care providers may recommend a pelvic exam and a Pap test every three years. For women ages 47-65, they may recommend the Pap test and pelvic exam, combined with testing for human papilloma virus (HPV), every five years. Some types of HPV increase your risk of cervical cancer. Testing for HPV may also be done on women of any age who have unclear Pap test results.  Other health care providers may not recommend any screening for nonpregnant women who are considered low risk for pelvic cancer and have no symptoms. Ask your health care provider if a screening pelvic exam is right for you.  If you have had past treatment  for cervical cancer or a condition that could lead to cancer, you need Pap tests and screening for cancer for at least 20 years after your treatment. If Pap tests have been discontinued for you, your risk factors (such as having a new sexual partner) need to be reassessed to determine if you should start having screenings again. Some women have medical problems that increase the chance of getting cervical cancer. In these cases, your health care provider may recommend that you have screening and Pap tests more often.  If you have a family history of uterine cancer or ovarian cancer, talk with your health care provider about genetic screening.  If you have vaginal bleeding after reaching menopause, tell your health care provider.  There are currently no reliable tests available to screen for ovarian cancer.  Lung Cancer Lung cancer screening is recommended for adults 39-32 years old who are at high risk for lung cancer because of a history of smoking. A yearly low-dose CT scan of the lungs is recommended if you:  Currently smoke.  Have a history of at least 30 pack-years of smoking and you currently smoke or have quit within the past 15 years. A pack-year is smoking an average of one  pack of cigarettes per day for one year.  Yearly screening should:  Continue until it has been 15 years since you quit.  Stop if you develop a health problem that would prevent you from having lung cancer treatment.  Colorectal Cancer  This type of cancer can be detected and can often be prevented.  Routine colorectal cancer screening usually begins at age 36 and continues through age 53.  If you have risk factors for colon cancer, your health care provider may recommend that you be screened at an earlier age.  If you have a family history of colorectal cancer, talk with your health care provider about genetic screening.  Your health care provider may also recommend using home test kits to check for hidden  blood in your stool.  A small camera at the end of a tube can be used to examine your colon directly (sigmoidoscopy or colonoscopy). This is done to check for the earliest forms of colorectal cancer.  Direct examination of the colon should be repeated every 5-10 years until age 71. However, if early forms of precancerous polyps or small growths are found or if you have a family history or genetic risk for colorectal cancer, you may need to be screened more often.  Skin Cancer  Check your skin from head to toe regularly.  Monitor any moles. Be sure to tell your health care provider: ? About any new moles or changes in moles, especially if there is a change in a mole's shape or color. ? If you have a mole that is larger than the size of a pencil eraser.  If any of your family members has a history of skin cancer, especially at a young age, talk with your health care provider about genetic screening.  Always use sunscreen. Apply sunscreen liberally and repeatedly throughout the day.  Whenever you are outside, protect yourself by wearing long sleeves, pants, a wide-brimmed hat, and sunglasses.  What should I know about osteoporosis? Osteoporosis is a condition in which bone destruction happens more quickly than new bone creation. After menopause, you may be at an increased risk for osteoporosis. To help prevent osteoporosis or the bone fractures that can happen because of osteoporosis, the following is recommended:  If you are 62-87 years old, get at least 1,000 mg of calcium and at least 600 mg of vitamin D per day.  If you are older than age 33 but younger than age 14, get at least 1,200 mg of calcium and at least 600 mg of vitamin D per day.  If you are older than age 3, get at least 1,200 mg of calcium and at least 800 mg of vitamin D per day.  Smoking and excessive alcohol intake increase the risk of osteoporosis. Eat foods that are rich in calcium and vitamin D, and do weight-bearing  exercises several times each week as directed by your health care provider. What should I know about how menopause affects my mental health? Depression may occur at any age, but it is more common as you become older. Common symptoms of depression include:  Low or sad mood.  Changes in sleep patterns.  Changes in appetite or eating patterns.  Feeling an overall lack of motivation or enjoyment of activities that you previously enjoyed.  Frequent crying spells.  Talk with your health care provider if you think that you are experiencing depression. What should I know about immunizations? It is important that you get and maintain your immunizations. These include:  Tetanus, diphtheria, and pertussis (Tdap) booster vaccine.  Influenza every year before the flu season begins.  Pneumonia vaccine.  Shingles vaccine.  Your health care provider may also recommend other immunizations. This information is not intended to replace advice given to you by your health care provider. Make sure you discuss any questions you have with your health care provider. Document Released: 10/02/2005 Document Revised: 02/28/2016 Document Reviewed: 05/14/2015 Elsevier Interactive Patient Education  2018 [employer]  

## 2017-05-25 LAB — CYTOLOGY - PAP
Adequacy: ABSENT
DIAGNOSIS: NEGATIVE
HPV (WINDOPATH): NOT DETECTED

## 2017-05-28 ENCOUNTER — Other Ambulatory Visit: Payer: Self-pay | Admitting: Emergency Medicine

## 2017-06-03 ENCOUNTER — Telehealth: Payer: Self-pay | Admitting: Family Medicine

## 2017-06-03 DIAGNOSIS — K649 Unspecified hemorrhoids: Secondary | ICD-10-CM

## 2017-06-03 NOTE — Telephone Encounter (Signed)
[name] [phone]  [name] called to see if she needed to still go and see the OB-GYN next week based on the recent test results.  She also stated that she also hemorrhoids and needs to see someone, does she need to go back and see doctor she seen in the past or does she need to come here.  Please call and advise

## 2017-06-07 NOTE — Telephone Encounter (Signed)
Called her back- please do go ahead and see her GYN which she has scheduled for this week. She has seen Dr. Earlean Shawl for her hemorrhoids in the past and would like to see him again, needs a referral I will place referral for her today

## 2017-06-10 ENCOUNTER — Encounter: Payer: Self-pay | Admitting: Family Medicine

## 2017-06-10 ENCOUNTER — Ambulatory Visit (INDEPENDENT_AMBULATORY_CARE_PROVIDER_SITE_OTHER): Payer: BLUE CROSS/BLUE SHIELD | Admitting: Family Medicine

## 2017-06-10 VITALS — BP 138/57 | HR 71 | Ht 66.0 in | Wt 164.0 lb

## 2017-06-10 DIAGNOSIS — N898 Other specified noninflammatory disorders of vagina: Secondary | ICD-10-CM

## 2017-06-10 DIAGNOSIS — T83728A Exposure of other implanted mesh and other prosthetic materials to surrounding organ or tissue, initial encounter: Secondary | ICD-10-CM

## 2017-06-10 NOTE — Progress Notes (Signed)
   Subjective:    Patient ID: [name], female    DOB: [birth year], 66 y.o.   MRN: [id number]  HPI 66yo G1P1, postmenopausal. Has history of LAVH with bilateral salpingo-oophorectomy, anterior and posterior colporrhaphy, vaginal colpopexy, with mesh insertion by Dr Gaetano Net in 2010 at [facility]. She was seen by her PCP for annual exam. A white lesion was seen in the anterior vagina and she was referred to my office for evaluation due to convenience for the patient.   Patient is asymptomatic - she denies dysuria, urinary incontinence, vaginal/pelvic pain, abnormal bleeding, discharge, etc.   Although she does not have a cervix post LAVH, PAP of vaginal cuff negative (done by PCP). Recent mammogram 03/2017: 0.7cm and 0.6cm benign cysts in right breast.   I have reviewed the patients past medical, family, and social history.  I have reviewed the patient's medication list and allergies.   Review of Systems  All other systems reviewed and are negative.      Objective:   Physical Exam  Constitutional: She appears well-developed and well-nourished.  HENT:  Head: Normocephalic and atraumatic.  Right Ear: External ear normal.  Left Ear: External ear normal.  Cardiovascular: Normal rate.   Pulmonary/Chest: Effort normal.  Abdominal: Soft. She exhibits no distension and no mass. There is no tenderness. There is no rebound and no guarding. Hernia confirmed negative in the right inguinal area and confirmed negative in the left inguinal area.  Genitourinary: There is no rash, tenderness, lesion or injury on the right labia. There is no rash, tenderness, lesion or injury on the left labia. No erythema, tenderness or bleeding in the vagina.  There is a foreign body (there is a portion of mesh visible in the right anterior part of the vagina, measuring 1.5cm in length. No bleeding visualized.) in the vagina. No signs of injury around the vagina. No vaginal discharge found.  Genitourinary  Comments: Uterus and adnexa surgically absent  Lymphadenopathy:       Right: No inguinal adenopathy present.       Left: No inguinal adenopathy present.  Skin: Skin is warm and dry.  Psychiatric: She has a normal mood and affect. Her behavior is normal. Judgment and thought content normal.       Assessment & Plan:  1. Exposure of other implanted genitourinary mesh, initial encounter [facility]) I discussed exam with patient. Will refer to urogyn: Dr Zigmund Daniel.   As the patient is older than 20 and no longer has a cervix, I would recommend no further PAP smears.  Follow-up with me as needed.

## 2017-06-10 NOTE — Progress Notes (Signed)
Pt states she is referred by Dr Lorelei Pont for vaginal problem. Pt is unsure of problem that was seen by PCP. Pt states it was noted upon vaginal exam with PCP. Pt denies any vaginal irritation, soreness.

## 2017-07-07 NOTE — Telephone Encounter (Signed)
Still have not received any medical records; have you seen these?/SLS 11/14

## 2017-08-25 DIAGNOSIS — K648 Other hemorrhoids: Secondary | ICD-10-CM | POA: Diagnosis not present

## 2018-01-21 DIAGNOSIS — Z0100 Encounter for examination of eyes and vision without abnormal findings: Secondary | ICD-10-CM | POA: Diagnosis not present

## 2018-01-21 DIAGNOSIS — H5200 Hypermetropia, unspecified eye: Secondary | ICD-10-CM | POA: Diagnosis not present

## 2018-02-16 ENCOUNTER — Other Ambulatory Visit: Payer: Self-pay | Admitting: Family Medicine

## 2018-02-16 DIAGNOSIS — Z1231 Encounter for screening mammogram for malignant neoplasm of breast: Secondary | ICD-10-CM

## 2018-03-24 ENCOUNTER — Ambulatory Visit
Admission: RE | Admit: 2018-03-24 | Discharge: 2018-03-24 | Disposition: A | Discharge status: Home / Self Care | Payer: BLUE CROSS/BLUE SHIELD | Source: Ambulatory Visit | Attending: Family Medicine | Admitting: Family Medicine

## 2018-03-24 DIAGNOSIS — Z1231 Encounter for screening mammogram for malignant neoplasm of breast: Secondary | ICD-10-CM | POA: Diagnosis not present

## 2018-04-21 ENCOUNTER — Ambulatory Visit (INDEPENDENT_AMBULATORY_CARE_PROVIDER_SITE_OTHER): Payer: Medicare HMO | Admitting: Family Medicine

## 2018-04-21 ENCOUNTER — Encounter: Payer: Self-pay | Admitting: Family Medicine

## 2018-04-21 VITALS — BP 134/84 | HR 74 | Temp 97.8°F | Resp 16 | Ht 66.0 in | Wt 162.0 lb

## 2018-04-21 DIAGNOSIS — Z711 Person with feared health complaint in whom no diagnosis is made: Secondary | ICD-10-CM

## 2018-04-21 NOTE — Patient Instructions (Signed)
It was good to see you today!  Take care, I will set you up to see dermatology for a skin check. However, if the skin lesion on your left leg does not resolve in the next month or so we can remove it for you (if you have not yet seen dermatology at that point) You are due for a tetanus shot- this can be given at your drug store at your convenience

## 2018-04-21 NOTE — Progress Notes (Signed)
[facility] [place], [place], [place] [address] [phone]  Date:  04/21/2018   Name:  [name]   DOB:  04-09-1951   MRN:  [id number]  PCP:  Darreld Mclean, MD    Chief Complaint: Skin lesion (arms, legs and chest, several months, raised up, itching, soreness)   History of Present Illness:  [name] is a 67 y.o. very pleasant female patient who presents with the following:  Last seen here in October for a physical  Today she is here with some skin lesions that have bothered her for various periods of time - she cannot really tell me how long they have been there, except that scaly lesion on her left thigh has been present for about 2 weeks so far She did have a skin cancer on her forehead at some point in the past and had it removed by derm- however her derm is no longer in her insurance network so she cannot continue to see them  Otherwise she is feeling well Declined a pneumonia vaccine today  Patient Active Problem List   Diagnosis Date Noted  . Grief at loss of child 05/24/2017  . Osteopenia 03/10/2017  . Liver abscess 09/13/2014  . Lesion of liver 09/13/2014  . Sepsis ([place]) 09/13/2014    No past medical history on file.  Past Surgical History:  Procedure Laterality Date  . BLADDER SURGERY      Social History   Tobacco Use  . Smoking status: Never Smoker  . Smokeless tobacco: Never Used  Substance Use Topics  . Alcohol use: No    Alcohol/week: 0.0 standard drinks  . Drug use: No    Family History  Problem Relation Age of Onset  . Healthy Mother   . Healthy Father     No Known Allergies  Medication list has been reviewed and updated.  Current Outpatient Medications on File Prior to Visit  Medication Sig Dispense Refill  . B Complex-C (SUPER B COMPLEX PO) Take 1 tablet by mouth daily.    . Biotin 1000 MCG tablet Take 1,000 mcg by mouth daily.    . calcium-vitamin  D 250-100 MG-UNIT per tablet Take 1 tablet by mouth 2 (two) times daily.    . Cinnamon 500 MG TABS Take 1 tablet by mouth daily.    Marland Kitchen estradiol (VIVELLE-DOT) 0.05 MG/24HR patch Place 1 patch (0.05 mg total) onto the skin 2 (two) times a week. 24 patch 3  . Flaxseed, Linseed, (FLAXSEED OIL) 1200 MG CAPS Take 1 capsule by mouth daily.    . Lactobacillus (ACIDOPHILUS PROBIOTIC PO) Take 1 tablet by mouth daily.    . Magnesium 250 MG TABS Take 1 tablet by mouth daily.    . Multiple Vitamins-Minerals (CENTRUM SILVER 50+WOMEN PO) Take 1 tablet by mouth daily.    Marland Kitchen Specialty Vitamins Products (ONE-A-DAY BONE STRENGTH PO) Take 1 tablet by mouth daily.    Marland Kitchen VAGIFEM 10 MCG TABS vaginal tablet Use 1-2 times a week, PV 24 tablet 3   No current facility-administered medications on file prior to visit.     Review of Systems:  As per HPI- otherwise negative.   Physical Examination: Vitals:   04/21/18 1303  BP: 134/84  Pulse: 74  Resp: 16  Temp: 97.8 F (36.6 C)  SpO2: 98%   Vitals:   04/21/18 1303  Weight: 162 lb (73.5 kg)  Height: 5\' 6"  (1.676 m)  Body mass index is 26.15 kg/m. Ideal Body Weight: Weight in (lb) to have BMI = 25: 154.6  GEN: WDWN, NAD, Non-toxic, A & O x 3, looks well  HEENT: Atraumatic, Normocephalic. Neck supple. No masses, No LAD. Ears and Nose: No external deformity. CV: RRR, No M/G/R. No JVD. No thrill. No extra heart sounds. PULM: CTA B, no wheezes, crackles, rhonchi. No retractions. No resp. distress. No accessory muscle use. EXTR: No c/c/e NEURO Normal gait.  PSYCH: Normally interactive. Conversant. Not depressed or anxious appearing.  Calm demeanor.  Pt appears to have had a lot of sun and has many "age spots," as well as some raised seb k's that she points out to me She has a rough, scaly, discrete skin lesion on her left thigh about 5 mm across.  This is the lesion that has been present for 2 weeks so far   Assessment and Plan: Concern about skin cancer  without diagnosis - Plan: Ambulatory referral to Dermatology  Referral to derm for complete skin check The lesion on her left thigh will be suspicious for skin cancer and need to come off if not resolved in the next month or so.  Pt will let me know if she does not have a derm appt yet- in that case I can remove for her Advised that she is due for a tetanus but will need to do at drug store as she has medicare - payment isuses   Signed Lamar Blinks, MD

## 2018-05-23 NOTE — Progress Notes (Addendum)
[facility] [address], [place], [place] [address] [phone]  Date:  05/26/2018   Name:  [name]   DOB:  [birth year]   MRN:  [id number]  PCP:  Darreld Mclean, MD    Chief Complaint: Annual Exam   History of Present Illness:  [name] is a 67 y.o. very pleasant female patient who presents with the following:  Here today for a CPE Last seen here a month ago for a skin concern, and a year ago for her CPE: History of liver abscess in 2016 She has been generally healthy except for an admission last January for about one week with a liver abscess- she was treated with ABX and recovered to her normal state of health She did see ID for follow-up - they referred her to [facility] to get a follow-up MRI of her liver and a colonoscopy.  She underwent colonoscopy in March of 2016. This was normal. She does not think she had a follow-up MRI however.  Tragically her son did pass away about 10 days ago. [name], 34, committed suicide while staying with his dad. He had suffered from depression for some time. [name] feels comforted by her knowledge that her son is now with the lord and states that she is ok, has no intent on self harm and declines any medication to help with sleep. She lists her church as her support system. Discussed the compassionate friends network and printed out local contact info for her, encouraged her to reach out to them when she feels ready. Reminded her that we are here for her if she needs anything. For the time being she is processing her grief on her own but will let me know if I can help in any way She sees OBG- Dr. Gaetano Net. She recently had a CPE with him.  She has not had labs in some time. She would like to contact her insurance company to find out what they may cover prior to having any tests done today  She works 2nd shift in [employer], [employer]. She returned to work this week.   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  She is looking forward to retirement  She is still on her vivelle dot and also uses her vagifem 1-2x a week She did have an oophorectomy years ago when she had a bladder tack operation- otherwise never had any vaginal surgery She has been on the vivelle patch for 15 years or so.   Would like to stay on this until she retires as her hot flashes were so bad without it No post-menopausal bleeding Today is actually the 1 year anniversary of the death of her son [name].  She feels that she is doing ok today  At our last visit I noticed a concerning finding in her vagina and referred her to see GYN- she saw Dr. Nehemiah Settle last October and he noted surgical mesh coming through her anterior vaginal wall.  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 Dr. Nehemiah Settle referred her to Dr. Zigmund Daniel, urogyn with [phone].  She underwent a revision in December and has done well since  She feels like her urinary sx are better . She does  want to continue her vaginal estrogen however to maintain these tissues   Tetanus: she recently cut her left arm in the garden. Will update today  Flu: do today  Pneumonia vaccine: she declines  Pap: last year , negative  Mammo: 8/19 Dexa: 2017, osteopenia.  At [facility] Colon: 3/16 Labs: a year ago   She is doing to [employer] later on today  She feels like she is doing ok as far as her mood - loss of son, see above   She is on the vivelle dot, 0.5 and applies one twice a week  She is also on the vaginal estrogen  Patient Active Problem List   Diagnosis Date Noted  . Grief at loss of child 05/24/2017  . Osteopenia 03/10/2017  . Liver abscess 09/13/2014  . Lesion of liver 09/13/2014  .  Sepsis ([place]) 09/13/2014    History reviewed. No pertinent past medical history.  Past Surgical History:  Procedure Laterality Date  . BLADDER SURGERY      Social History   Tobacco Use  . Smoking status: Never Smoker  . Smokeless tobacco: Never Used  Substance Use Topics  . Alcohol use: No    Alcohol/week: 0.0 standard drinks  . Drug use: No    Family History  Problem Relation Age of Onset  . Healthy Mother   . Healthy Father     No Known Allergies  Medication list has been reviewed and updated.  Current Outpatient Medications on File Prior to Visit  Medication Sig Dispense Refill  . B Complex-C (SUPER B COMPLEX PO) Take 1 tablet by mouth daily.    . Biotin 1000 MCG tablet Take 1,000 mcg by mouth daily.    . calcium-vitamin D 250-100 MG-UNIT per tablet Take 1 tablet by mouth 2 (two) times daily.    . Cinnamon 500 MG TABS Take 1 tablet by mouth daily.    . Flaxseed, Linseed, (FLAXSEED OIL) 1200 MG CAPS Take 1 capsule by mouth daily.    . Lactobacillus (ACIDOPHILUS PROBIOTIC PO) Take 1 tablet by mouth daily.    . Magnesium 250 MG TABS Take 1 tablet by mouth daily.    . Multiple Vitamins-Minerals (CENTRUM SILVER 50+WOMEN PO) Take 1 tablet by mouth daily.    Marland Kitchen Specialty Vitamins Products (ONE-A-DAY BONE STRENGTH PO) Take 1 tablet by mouth daily.     No current facility-administered medications on file prior to visit.     Review of Systems:  As per HPI- otherwise negative. No CP or SOB No breast changes or concerns   Physical Examination: Vitals:   05/26/18 0910  BP: 128/68  Pulse: 76  Resp: 16  Temp: 97.8 F (36.6 C)  SpO2: 95%   Vitals:   05/26/18 0910  Weight: 165 lb (74.8 kg)  Height: 5\' 6"  (1.676 m)   Body mass index is 26.63 kg/m. Ideal Body Weight: Weight in (lb) to have BMI = 25: 154.6  GEN: WDWN, NAD, Non-toxic, A & O x 3 HEENT: Atraumatic, Normocephalic. Neck supple. No masses, No LAD.  Bilateral TM wnl, oropharynx normal.  PEERL,EOMI.    Ears and Nose: No external deformity. CV: RRR, No M/G/R. No JVD. No thrill. No extra heart sounds. PULM: CTA B, no wheezes, crackles, rhonchi. No retractions. No resp. distress. No accessory muscle use. ABD: S, NT, ND, +BS. No rebound. No HSM. EXTR: No c/c/e NEURO Normal gait.  PSYCH: Normally interactive. Conversant. Not depressed or anxious appearing.  Calm demeanor.  Looks well and younger  than age  Recent small lac on left forearm  Healing well, does not need suturing   Assessment and Plan: Physical exam  Screening for hyperlipidemia - Plan: Lipid panel  Screening for diabetes mellitus - Plan: Comprehensive metabolic panel, Hemoglobin A1c  Screening for deficiency anemia - Plan: CBC  Menopausal symptoms - Plan: VAGIFEM 10 MCG TABS vaginal tablet  Immunization due - Plan: Flu vaccine HIGH DOSE PF (Fluzone High dose)  Laceration of left upper arm without complication, initial encounter - Plan: Tdap vaccine greater than or equal to 7yo IM  Screening for osteoporosis - Plan: DG Bone Density  Estrogen deficiency - Plan: DG Bone Density, estradiol (VIVELLE-DOT) 0.025 MG/24HR physical exam today Labs pending as above Ordered dexa for her Flu shot and tdap given  Will start to taper off her vivelle dot as she would like to come off this Continue low dose vaginal estrogen however    Signed Lamar Blinks, MD  Received her dexa scan- letter to pt   Dg Bone Density  Result Date: 05/26/2018 EXAM: DUAL X-RAY ABSORPTIOMETRY (DXA) FOR BONE MINERAL DENSITY IMPRESSION: [place] Your patient [name] completed a BMD test on 05/26/2018 using the [employer] (analysis version: 16.SP2) manufactured by [employer]. The following summarizes the results of our evaluation. PATIENT: Name: [name], [name] Patient ID: [id number] Birth Date: 1951-06-11 Height: 66.5 in. Gender: Female Measured: 05/26/2018 Weight: 164.0 lbs. Indications: Caucasian, Estrogen Deficiency,  History of Osteopenia, Hysterectomy, Low Calcium Intake, Oophorectomy ( Bilateral), Post Menopausal Fractures: Treatments: HRT, Multivitamin, Vitamin D ASSESSMENT: The BMD measured at Femur Neck Right is 0.856 g/cm2 with a T-score of -1.3. This patient is considered osteopenic according to [facility]) criteria. Patient was not given a FRAX because she is on HRT. L-3 was excluded due to degenerative changes.The scan quality is limited because L-3 was excluded. Site Region Measured Date Measured Age WHO YA BMD Classification T-score AP Spine L1-L4 (L3) 05/26/2018 67.8 Normal -0.3 1.132 g/cm2 DualFemur Neck Right 05/26/2018 67.8 years Osteopenia -1.3 0.856 g/cm2 World Health Organization [facility]) criteria for post-menopausal, Caucasian Women: Normal       T-score at or above -1 SD Osteopenia   T-score between -1 and -2.5 SD Osteoporosis T-score at or below -2.5 SD RECOMMENDATION:1. All patients should optimize calcium and vitamin D intake. 2. Consider FDA-approved medical therapies in postmenopausal women and men aged 61 years and older, based on the following: a. A hip or vertebral(clinical or morphometric) fracture. b. T-Score < -2.5 at the femoral neck or spine after appropriate evaluation to exclude secondary causes c. Low bone mass (T-score between -1.0 and -2.5 at the femoral neck or spine) and a 10 year probability of a hip fracture >3% or a 10 year probability of major osteoporosis-related fracture > 20% based on the [place]-adapted WHO algorithm d. Clinical judgement and/or patient preferences may indicate treatment for people with 10-year fracture probabilities above or below these levels FOLLOW-UP: Patients with diagnosis of osteoporosis or at high risk for fracture should have regular bone mineral density tests. For patients eligible for Medicare, routine testing is allowed once every 2 years. The testing frequency can be increased to one year for patients who have rapidly progressing disease,  those who are receiving or discontinuing medical therapy to restore bone mass, or have additional risk factors. I have reviewed this report and agree with the above findings. [facility] Radiology Electronically Signed   By: Lowella Grip III M.D.   On: 05/26/2018  10:42    Also received her labs, letter to pt  Results for orders placed or performed in visit on 05/26/18  CBC  Result Value Ref Range   WBC 6.5 4.0 - 10.5 K/uL   RBC 4.95 3.87 - 5.11 Mil/uL   Platelets 170.0 150.0 - 400.0 K/uL   Hemoglobin 15.0 12.0 - 15.0 g/dL   HCT 45.1 36.0 - 46.0 %   MCV 91.0 78.0 - 100.0 fl   MCHC 33.4 30.0 - 36.0 g/dL   RDW 13.4 11.5 - 15.5 %  Comprehensive metabolic panel  Result Value Ref Range   Sodium 139 135 - 145 mEq/L   Potassium 4.2 3.5 - 5.1 mEq/L   Chloride 103 96 - 112 mEq/L   CO2 30 19 - 32 mEq/L   Glucose, Bld 87 70 - 99 mg/dL   BUN 13 6 - 23 mg/dL   Creatinine, Ser 0.80 0.40 - 1.20 mg/dL   Total Bilirubin 1.1 0.2 - 1.2 mg/dL   Alkaline Phosphatase 63 39 - 117 U/L   AST 19 0 - 37 U/L   ALT 13 0 - 35 U/L   Total Protein 7.2 6.0 - 8.3 g/dL   Albumin 4.2 3.5 - 5.2 g/dL   Calcium 9.2 8.4 - 10.5 mg/dL   GFR 75.85 >60.00 mL/min  Hemoglobin A1c  Result Value Ref Range   Hgb A1c MFr Bld 5.3 4.6 - 6.5 %  Lipid panel  Result Value Ref Range   Cholesterol 181 0 - 200 mg/dL   Triglycerides 93.0 0.0 - 149.0 mg/dL   HDL 54.20 >39.00 mg/dL   VLDL 18.6 0.0 - 40.0 mg/dL   LDL Cholesterol 108 (H) 0 - 99 mg/dL   Total CHOL/HDL Ratio 3    NonHDL 126.99

## 2018-05-26 ENCOUNTER — Ambulatory Visit (INDEPENDENT_AMBULATORY_CARE_PROVIDER_SITE_OTHER): Payer: Medicare HMO | Admitting: Family Medicine

## 2018-05-26 ENCOUNTER — Ambulatory Visit (HOSPITAL_BASED_OUTPATIENT_CLINIC_OR_DEPARTMENT_OTHER)
Admission: RE | Admit: 2018-05-26 | Discharge: 2018-05-26 | Disposition: A | Discharge status: Home / Self Care | Payer: Medicare HMO | Source: Ambulatory Visit | Attending: Family Medicine | Admitting: Family Medicine

## 2018-05-26 ENCOUNTER — Encounter: Payer: Self-pay | Admitting: Family Medicine

## 2018-05-26 VITALS — BP 128/68 | HR 76 | Temp 97.8°F | Resp 16 | Ht 66.0 in | Wt 165.0 lb

## 2018-05-26 DIAGNOSIS — E2839 Other primary ovarian failure: Secondary | ICD-10-CM | POA: Diagnosis not present

## 2018-05-26 DIAGNOSIS — Z13220 Encounter for screening for lipoid disorders: Secondary | ICD-10-CM | POA: Diagnosis not present

## 2018-05-26 DIAGNOSIS — Z13820 Encounter for screening for osteoporosis: Secondary | ICD-10-CM | POA: Diagnosis not present

## 2018-05-26 DIAGNOSIS — Z131 Encounter for screening for diabetes mellitus: Secondary | ICD-10-CM

## 2018-05-26 DIAGNOSIS — N951 Menopausal and female climacteric states: Secondary | ICD-10-CM

## 2018-05-26 DIAGNOSIS — L820 Inflamed seborrheic keratosis: Secondary | ICD-10-CM | POA: Diagnosis not present

## 2018-05-26 DIAGNOSIS — D0462 Carcinoma in situ of skin of left upper limb, including shoulder: Secondary | ICD-10-CM | POA: Diagnosis not present

## 2018-05-26 DIAGNOSIS — M85851 Other specified disorders of bone density and structure, right thigh: Secondary | ICD-10-CM | POA: Diagnosis not present

## 2018-05-26 DIAGNOSIS — S41112A Laceration without foreign body of left upper arm, initial encounter: Secondary | ICD-10-CM | POA: Diagnosis not present

## 2018-05-26 DIAGNOSIS — Z130 Encounter for screening for diseases of the blood and blood-forming organs and certain disorders involving the immune mechanism: Secondary | ICD-10-CM | POA: Diagnosis not present

## 2018-05-26 DIAGNOSIS — L814 Other melanin hyperpigmentation: Secondary | ICD-10-CM | POA: Diagnosis not present

## 2018-05-26 DIAGNOSIS — D0461 Carcinoma in situ of skin of right upper limb, including shoulder: Secondary | ICD-10-CM | POA: Diagnosis not present

## 2018-05-26 DIAGNOSIS — Z0000 Encounter for general adult medical examination without abnormal findings: Secondary | ICD-10-CM

## 2018-05-26 DIAGNOSIS — M8580 Other specified disorders of bone density and structure, unspecified site: Secondary | ICD-10-CM | POA: Insufficient documentation

## 2018-05-26 DIAGNOSIS — L821 Other seborrheic keratosis: Secondary | ICD-10-CM | POA: Diagnosis not present

## 2018-05-26 DIAGNOSIS — Z780 Asymptomatic menopausal state: Secondary | ICD-10-CM | POA: Diagnosis not present

## 2018-05-26 DIAGNOSIS — Z23 Encounter for immunization: Secondary | ICD-10-CM

## 2018-05-26 DIAGNOSIS — L570 Actinic keratosis: Secondary | ICD-10-CM | POA: Diagnosis not present

## 2018-05-26 LAB — LIPID PANEL
CHOLESTEROL: 181 mg/dL (ref 0–200)
HDL: 54.2 mg/dL (ref >39.00)
LDL Cholesterol: 108 mg/dL — ABNORMAL HIGH (ref 0–99)
NonHDL: 126.99
Total CHOL/HDL Ratio: 3
Triglycerides: 93 mg/dL (ref 0.0–149.0)
VLDL: 18.6 mg/dL (ref 0.0–40.0)

## 2018-05-26 LAB — COMPREHENSIVE METABOLIC PANEL
ALT: 13 U/L (ref 0–35)
AST: 19 U/L (ref 0–37)
Albumin: 4.2 g/dL (ref 3.5–5.2)
Alkaline Phosphatase: 63 U/L (ref 39–117)
BUN: 13 mg/dL (ref 6–23)
CHLORIDE: 103 meq/L (ref 96–112)
CO2: 30 meq/L (ref 19–32)
CREATININE: 0.8 mg/dL (ref 0.40–1.20)
Calcium: 9.2 mg/dL (ref 8.4–10.5)
GFR: 75.85 mL/min (ref >60.00)
Glucose, Bld: 87 mg/dL (ref 70–99)
Potassium: 4.2 mEq/L (ref 3.5–5.1)
SODIUM: 139 meq/L (ref 135–145)
Total Bilirubin: 1.1 mg/dL (ref 0.2–1.2)
Total Protein: 7.2 g/dL (ref 6.0–8.3)

## 2018-05-26 LAB — CBC
HCT: 45.1 % (ref 36.0–46.0)
Hemoglobin: 15 g/dL (ref 12.0–15.0)
MCHC: 33.4 g/dL (ref 30.0–36.0)
MCV: 91 fl (ref 78.0–100.0)
Platelets: 170 10*3/uL (ref 150.0–400.0)
RBC: 4.95 Mil/uL (ref 3.87–5.11)
RDW: 13.4 % (ref 11.5–15.5)
WBC: 6.5 10*3/uL (ref 4.0–10.5)

## 2018-05-26 LAB — HEMOGLOBIN A1C: Hgb A1c MFr Bld: 5.3 % (ref 4.6–6.5)

## 2018-05-26 MED ORDER — ESTRADIOL 0.025 MG/24HR TD PTTW: 1.0000 | MEDICATED_PATCH | TRANSDERMAL | 3 refills | Status: DC

## 2018-05-26 MED ORDER — VAGIFEM 10 MCG VA TABS: ORAL_TABLET | VAGINAL | 3 refills | Status: DC

## 2018-05-26 NOTE — Patient Instructions (Addendum)
Good to see you today!   I will be in touch with your labs asap You got your flu and tetanus shots today I refilled your estrogen patch but at the lower dose- 0.025.  Continue to use for a few months and then stop Can use through your upcoming [place] trip if you like.  Can continue the vaginal estrogen   Let me know if any problems or concerns Otherwise we can plan to visit in one year   Health Maintenance, Female Adopting a healthy lifestyle and getting preventive care can go a long way to promote health and wellness. Talk with your health care provider about what schedule of regular examinations is right for you. This is a good chance for you to check in with your provider about disease prevention and staying healthy. In between checkups, there are plenty of things you can do on your own. Experts have done a lot of research about which lifestyle changes and preventive measures are most likely to keep you healthy. Ask your health care provider for more information. Weight and diet Eat a healthy diet  Be sure to include plenty of vegetables, fruits, low-fat dairy products, and lean protein.  Do not eat a lot of foods high in solid fats, added sugars, or salt.  Get regular exercise. This is one of the most important things you can do for your health. ? Most adults should exercise for at least 150 minutes each week. The exercise should increase your heart rate and make you sweat (moderate-intensity exercise). ? Most adults should also do strengthening exercises at least twice a week. This is in addition to the moderate-intensity exercise.  Maintain a healthy weight  Body mass index (BMI) is a measurement that can be used to identify possible weight problems. It estimates body fat based on height and weight. Your health care provider can help determine your BMI and help you achieve or maintain a healthy weight.  For females 32 years of age and older: ? A BMI below 18.5 is considered  underweight. ? A BMI of 18.5 to 24.9 is normal. ? A BMI of 25 to 29.9 is considered overweight. ? A BMI of 30 and above is considered obese.  Watch levels of cholesterol and blood lipids  You should start having your blood tested for lipids and cholesterol at 68 years of age, then have this test every 5 years.  You may need to have your cholesterol levels checked more often if: ? Your lipid or cholesterol levels are high. ? You are older than 68 years of age. ? You are at high risk for heart disease.  Cancer screening Lung Cancer  Lung cancer screening is recommended for adults 78-33 years old who are at high risk for lung cancer because of a history of smoking.  A yearly low-dose CT scan of the lungs is recommended for people who: ? Currently smoke. ? Have quit within the past 15 years. ? Have at least a 30-pack-year history of smoking. A pack year is smoking an average of one pack of cigarettes a day for 1 year.  Yearly screening should continue until it has been 15 years since you quit.  Yearly screening should stop if you develop a health problem that would prevent you from having lung cancer treatment.  Breast Cancer  Practice breast self-awareness. This means understanding how your breasts normally appear and feel.  It also means doing regular breast self-exams. Let your health care provider know about any changes,  no matter how small.  If you are in your 20s or 30s, you should have a clinical breast exam (CBE) by a health care provider every 1-3 years as part of a regular health exam.  If you are 73 or older, have a CBE every year. Also consider having a breast X-ray (mammogram) every year.  If you have a family history of breast cancer, talk to your health care provider about genetic screening.  If you are at high risk for breast cancer, talk to your health care provider about having an MRI and a mammogram every year.  Breast cancer gene (BRCA) assessment is  recommended for women who have family members with BRCA-related cancers. BRCA-related cancers include: ? Breast. ? Ovarian. ? Tubal. ? Peritoneal cancers.  Results of the assessment will determine the need for genetic counseling and BRCA1 and BRCA2 testing.  Cervical Cancer Your health care provider may recommend that you be screened regularly for cancer of the pelvic organs (ovaries, uterus, and vagina). This screening involves a pelvic examination, including checking for microscopic changes to the surface of your cervix (Pap test). You may be encouraged to have this screening done every 3 years, beginning at age 29.  For women ages 43-65, health care providers may recommend pelvic exams and Pap testing every 3 years, or they may recommend the Pap and pelvic exam, combined with testing for human papilloma virus (HPV), every 5 years. Some types of HPV increase your risk of cervical cancer. Testing for HPV may also be done on women of any age with unclear Pap test results.  Other health care providers may not recommend any screening for nonpregnant women who are considered low risk for pelvic cancer and who do not have symptoms. Ask your health care provider if a screening pelvic exam is right for you.  If you have had past treatment for cervical cancer or a condition that could lead to cancer, you need Pap tests and screening for cancer for at least 20 years after your treatment. If Pap tests have been discontinued, your risk factors (such as having a new sexual partner) need to be reassessed to determine if screening should resume. Some women have medical problems that increase the chance of getting cervical cancer. In these cases, your health care provider may recommend more frequent screening and Pap tests.  Colorectal Cancer  This type of cancer can be detected and often prevented.  Routine colorectal cancer screening usually begins at 68 years of age and continues through 68 years of  age.  Your health care provider may recommend screening at an earlier age if you have risk factors for colon cancer.  Your health care provider may also recommend using home test kits to check for hidden blood in the stool.  A small camera at the end of a tube can be used to examine your colon directly (sigmoidoscopy or colonoscopy). This is done to check for the earliest forms of colorectal cancer.  Routine screening usually begins at age 86.  Direct examination of the colon should be repeated every 5-10 years through 68 years of age. However, you may need to be screened more often if early forms of precancerous polyps or small growths are found.  Skin Cancer  Check your skin from head to toe regularly.  Tell your health care provider about any new moles or changes in moles, especially if there is a change in a mole's shape or color.  Also tell your health care provider if you  have a mole that is larger than the size of a pencil eraser.  Always use sunscreen. Apply sunscreen liberally and repeatedly throughout the day.  Protect yourself by wearing long sleeves, pants, a wide-brimmed hat, and sunglasses whenever you are outside.  Heart disease, diabetes, and high blood pressure  High blood pressure causes heart disease and increases the risk of stroke. High blood pressure is more likely to develop in: ? People who have blood pressure in the high end of the normal range (130-139/85-89 mm Hg). ? People who are overweight or obese. ? People who are African American.  If you are 103-57 years of age, have your blood pressure checked every 3-5 years. If you are 6 years of age or older, have your blood pressure checked every year. You should have your blood pressure measured twice-once when you are at a hospital or clinic, and once when you are not at a hospital or clinic. Record the average of the two measurements. To check your blood pressure when you are not at a hospital or clinic, you  can use: ? An automated blood pressure machine at a pharmacy. ? A home blood pressure monitor.  If you are between 46 years and 62 years old, ask your health care provider if you should take aspirin to prevent strokes.  Have regular diabetes screenings. This involves taking a blood sample to check your fasting blood sugar level. ? If you are at a normal weight and have a low risk for diabetes, have this test once every three years after 68 years of age. ? If you are overweight and have a high risk for diabetes, consider being tested at a younger age or more often. Preventing infection Hepatitis B  If you have a higher risk for hepatitis B, you should be screened for this virus. You are considered at high risk for hepatitis B if: ? You were born in a country where hepatitis B is common. Ask your health care provider which countries are considered high risk. ? Your parents were born in a high-risk country, and you have not been immunized against hepatitis B (hepatitis B vaccine). ? You have HIV or AIDS. ? You use needles to inject street drugs. ? You live with someone who has hepatitis B. ? You have had sex with someone who has hepatitis B. ? You get hemodialysis treatment. ? You take certain medicines for conditions, including cancer, organ transplantation, and autoimmune conditions.  Hepatitis C  Blood testing is recommended for: ? Everyone born from [birth year] through 1966. ? Anyone with known risk factors for hepatitis C.  Sexually transmitted infections (STIs)  You should be screened for sexually transmitted infections (STIs) including gonorrhea and chlamydia if: ? You are sexually active and are younger than 68 years of age. ? You are older than 68 years of age and your health care provider tells you that you are at risk for this type of infection. ? Your sexual activity has changed since you were last screened and you are at an increased risk for chlamydia or gonorrhea. Ask your  health care provider if you are at risk.  If you do not have HIV, but are at risk, it may be recommended that you take a prescription medicine daily to prevent HIV infection. This is called pre-exposure prophylaxis (PrEP). You are considered at risk if: ? You are sexually active and do not regularly use condoms or know the HIV status of your partner(s). ? You take drugs by injection. ?  You are sexually active with a partner who has HIV.  Talk with your health care provider about whether you are at high risk of being infected with HIV. If you choose to begin PrEP, you should first be tested for HIV. You should then be tested every 3 months for as long as you are taking PrEP. Pregnancy  If you are premenopausal and you may become pregnant, ask your health care provider about preconception counseling.  If you may become pregnant, take 400 to 800 micrograms (mcg) of folic acid every day.  If you want to prevent pregnancy, talk to your health care provider about birth control (contraception). Osteoporosis and menopause  Osteoporosis is a disease in which the bones lose minerals and strength with aging. This can result in serious bone fractures. Your risk for osteoporosis can be identified using a bone density scan.  If you are 74 years of age or older, or if you are at risk for osteoporosis and fractures, ask your health care provider if you should be screened.  Ask your health care provider whether you should take a calcium or vitamin D supplement to lower your risk for osteoporosis.  Menopause may have certain physical symptoms and risks.  Hormone replacement therapy may reduce some of these symptoms and risks. Talk to your health care provider about whether hormone replacement therapy is right for you. Follow these instructions at home:  Schedule regular health, dental, and eye exams.  Stay current with your immunizations.  Do not use any tobacco products including cigarettes, chewing  tobacco, or electronic cigarettes.  If you are pregnant, do not drink alcohol.  If you are breastfeeding, limit how much and how often you drink alcohol.  Limit alcohol intake to no more than 1 drink per day for nonpregnant women. One drink equals 12 ounces of beer, 5 ounces of wine, or 1 ounces of hard liquor.  Do not use street drugs.  Do not share needles.  Ask your health care provider for help if you need support or information about quitting drugs.  Tell your health care provider if you often feel depressed.  Tell your health care provider if you have ever been abused or do not feel safe at home. This information is not intended to replace advice given to you by your health care provider. Make sure you discuss any questions you have with your health care provider. Document Released: 02/23/2011 Document Revised: 01/16/2016 Document Reviewed: 05/14/2015 Elsevier Interactive Patient Education  [employer].

## 2018-06-01 ENCOUNTER — Telehealth: Payer: Self-pay | Admitting: *Deleted

## 2018-06-01 ENCOUNTER — Encounter: Payer: Self-pay | Admitting: Family Medicine

## 2018-06-01 DIAGNOSIS — C4490 Unspecified malignant neoplasm of skin, unspecified: Secondary | ICD-10-CM | POA: Insufficient documentation

## 2018-06-01 NOTE — Telephone Encounter (Signed)
Received Dermatopathology Report results from [facility]; forwarded to provider/SLS 10/09

## 2018-06-03 ENCOUNTER — Telehealth: Payer: Self-pay | Admitting: Family Medicine

## 2018-06-03 DIAGNOSIS — D0461 Carcinoma in situ of skin of right upper limb, including shoulder: Secondary | ICD-10-CM | POA: Diagnosis not present

## 2018-06-03 DIAGNOSIS — L820 Inflamed seborrheic keratosis: Secondary | ICD-10-CM | POA: Diagnosis not present

## 2018-06-03 DIAGNOSIS — D0462 Carcinoma in situ of skin of left upper limb, including shoulder: Secondary | ICD-10-CM | POA: Diagnosis not present

## 2018-06-03 NOTE — Telephone Encounter (Signed)
PA was initiated via Covermymeds on 05/31/2018, KEY: [id number]. Informed that medication was available w/o coverage.

## 2018-06-03 NOTE — Telephone Encounter (Signed)
Copied from [place] [id number]. Topic: Quick Communication - Rx Refill/Question >> Jun 03, 2018 11:44 AM Oliver Pila B wrote: Medication: estradiol (VIVELLE-DOT) 0.025 MG/24HR [[id number]]  VAGIFEM 10 MCG TABS vaginal tablet [[id number]]   Pt called and is needing a PA for the medications above to be filled

## 2018-06-13 NOTE — Telephone Encounter (Signed)
Per letter they don't want to cover both vivelle dot and vaginal estrogen. I will have to call them.  Called and updated pt on plan

## 2018-06-13 NOTE — Telephone Encounter (Signed)
Letter patient brought has been placed in Dr. Serita Grit notes folder at desk.Please see note.

## 2018-06-13 NOTE — Telephone Encounter (Signed)
Pt requesting a call with an update on the PA for Vagifem.

## 2018-06-13 NOTE — Telephone Encounter (Signed)
Pt came into office and dropped off a letter stating her prescription order was not processed. Pt states she would like for [place] to get this straight. Please call pt once medication is sent to pharmacy. I placed the letter in Prior Authorization folder in the front office and also a copy of the letter in Dr. Lillie Fragmin prescription folder.

## 2018-06-13 NOTE — Telephone Encounter (Signed)
PA re-initiated via Covermymeds; KEY: [id number]. Did not receive error that medication is already covered this time. Awaiting determination.

## 2018-06-14 NOTE — Telephone Encounter (Signed)
PA approved.   PA Case: [id number], Status: Approved, Coverage Starts on: 06/13/2018 12:00:00 AM, Coverage Ends on: 08/24/2019 12:00:00 AM. Questions? Contact [phone].

## 2018-06-14 NOTE — Telephone Encounter (Signed)
This was initiated yesterday- awaiting response from insurance.

## 2018-06-14 NOTE — Telephone Encounter (Signed)
I called [employer] to try and sort this out for patient,  After 27:30 minutes on the phone I was able to find out that the Vivelle dot is approved but the Vagifem tablet is in the prior auth process still.   [name] have you heard anything about this prior auth?  For the Vagifem tab not the vivelle dot

## 2018-09-14 DIAGNOSIS — Z8742 Personal history of other diseases of the female genital tract: Secondary | ICD-10-CM | POA: Diagnosis not present

## 2019-02-11 DIAGNOSIS — H2513 Age-related nuclear cataract, bilateral: Secondary | ICD-10-CM | POA: Diagnosis not present

## 2019-02-11 DIAGNOSIS — Z135 Encounter for screening for eye and ear disorders: Secondary | ICD-10-CM | POA: Diagnosis not present

## 2019-02-11 DIAGNOSIS — H5200 Hypermetropia, unspecified eye: Secondary | ICD-10-CM | POA: Diagnosis not present

## 2019-02-13 ENCOUNTER — Other Ambulatory Visit: Payer: Self-pay | Admitting: Family Medicine

## 2019-02-13 DIAGNOSIS — Z1231 Encounter for screening mammogram for malignant neoplasm of breast: Secondary | ICD-10-CM

## 2019-03-30 ENCOUNTER — Other Ambulatory Visit: Payer: Self-pay

## 2019-03-30 ENCOUNTER — Ambulatory Visit
Admission: RE | Admit: 2019-03-30 | Discharge: 2019-03-30 | Disposition: A | Discharge status: Home / Self Care | Payer: Medicare HMO | Source: Ambulatory Visit | Attending: Family Medicine | Admitting: Family Medicine

## 2019-03-30 DIAGNOSIS — Z1231 Encounter for screening mammogram for malignant neoplasm of breast: Secondary | ICD-10-CM

## 2019-03-31 ENCOUNTER — Other Ambulatory Visit: Payer: Self-pay | Admitting: Family Medicine

## 2019-03-31 DIAGNOSIS — L570 Actinic keratosis: Secondary | ICD-10-CM | POA: Diagnosis not present

## 2019-03-31 DIAGNOSIS — C44311 Basal cell carcinoma of skin of nose: Secondary | ICD-10-CM | POA: Diagnosis not present

## 2019-03-31 DIAGNOSIS — L820 Inflamed seborrheic keratosis: Secondary | ICD-10-CM | POA: Diagnosis not present

## 2019-03-31 DIAGNOSIS — R921 Mammographic calcification found on diagnostic imaging of breast: Secondary | ICD-10-CM

## 2019-03-31 DIAGNOSIS — Z85828 Personal history of other malignant neoplasm of skin: Secondary | ICD-10-CM | POA: Diagnosis not present

## 2019-04-04 ENCOUNTER — Ambulatory Visit
Admission: RE | Admit: 2019-04-04 | Discharge: 2019-04-04 | Disposition: A | Discharge status: Home / Self Care | Payer: Medicare HMO | Source: Ambulatory Visit | Attending: Family Medicine | Admitting: Family Medicine

## 2019-04-04 ENCOUNTER — Other Ambulatory Visit: Payer: Self-pay

## 2019-04-04 DIAGNOSIS — R921 Mammographic calcification found on diagnostic imaging of breast: Secondary | ICD-10-CM | POA: Diagnosis not present

## 2019-05-27 NOTE — Progress Notes (Addendum)
[place] at [facility] [address], [place], [place] [address] [phone]  Date:  05/29/2019   Name:  [name]   DOB:  Jun 10, 1951   MRN:  [id number]  PCP:  Darreld Mclean, MD    Chief Complaint: Annual Exam (flu shot)   History of Present Illness:  [name] is a 68 y.o. very pleasant female patient who presents with the following:  Here today for physical exam Last seen by myself about 1 year ago for physical  She is generally been in good health except for a liver abscess that occurred in 2016.  She recovered fully She lost her son [name] to suicide in 2019.  He was 69 years old During the pandemic she has been staying home more but staying in touch with friends  Her dermatologist is Dr. Elvera Lennox, she sees him regularly  Mammogram is up-to-date Bone density scan last year Colon cancer screen up-to-date Recommend flu, pneumonia vaccine- will give prevnar today as well as flu shot Can also suggest Shingrix at pharmacy Labs 1 year ago- fasting today   She took herself off the vivelle dot but does still use the vagifem vaginal tablet No postmenopausal bleeding, no chest pain or shortness of breath She likes to be outdoors as much as possible  Patient Active Problem List   Diagnosis Date Noted  . Skin cancer 06/01/2018  . Grief at loss of child 05/24/2017  . Osteopenia 03/10/2017  . Liver abscess 09/13/2014  . Lesion of liver 09/13/2014  . Sepsis ([place]) 09/13/2014    No past medical history on file.  Past Surgical History:  Procedure Laterality Date  . BLADDER SURGERY      Social History   Tobacco Use  . Smoking status: Never Smoker  . Smokeless tobacco: Never Used  Substance Use Topics  . Alcohol use: No    Alcohol/week: 0.0 standard drinks  . Drug use: No    Family History  Problem Relation Age of Onset  . Healthy Mother   . Healthy Father     No Known Allergies  Medication  list has been reviewed and updated.  Current Outpatient Medications on File Prior to Visit  Medication Sig Dispense Refill  . B Complex-C (SUPER B COMPLEX PO) Take 1 tablet by mouth daily.    . Biotin 1000 MCG tablet Take 1,000 mcg by mouth daily.    . calcium-vitamin D 250-100 MG-UNIT per tablet Take 1 tablet by mouth 2 (two) times daily.    . Cinnamon 500 MG TABS Take 1 tablet by mouth daily.    . Flaxseed, Linseed, (FLAXSEED OIL) 1200 MG CAPS Take 1 capsule by mouth daily.    . Lactobacillus (ACIDOPHILUS PROBIOTIC PO) Take 1 tablet by mouth daily.    . Magnesium 250 MG TABS Take 1 tablet by mouth daily.    . Multiple Vitamins-Minerals (CENTRUM SILVER 50+WOMEN PO) Take 1 tablet by mouth daily.    Marland Kitchen Specialty Vitamins Products (ONE-A-DAY BONE STRENGTH PO) Take 1 tablet by mouth daily.    Marland Kitchen UNABLE TO FIND Med Name: Beta Yam 900 cream bid    . UNABLE TO FIND Take 15 mg by mouth daily. Med Name: Life Extension DHEA    . UNABLE TO FIND 2 (two) times daily. Med Name: Transitions menopause support    . VAGIFEM 10 MCG TABS vaginal tablet Use 1-2 times a week, PV 24 tablet 3   No  current facility-administered medications on file prior to visit.     Review of Systems:  As per HPI- otherwise negative.  No fever or chills Physical Examination: Vitals:   05/29/19 0940  BP: 122/88  Pulse: 73  Resp: 16  Temp: 97.8 F (36.6 C)  SpO2: 98%   Vitals:   05/29/19 0940  Weight: 167 lb (75.8 kg)  Height: 5\' 6"  (1.676 m)   Body mass index is 26.95 kg/m. Ideal Body Weight: Weight in (lb) to have BMI = 25: 154.6  GEN: WDWN, NAD, Non-toxic, A & O x 3, normal weight, looks well HEENT: Atraumatic, Normocephalic. Neck supple. No masses, No LAD.  TM within normal lungs bilaterally Ears and Nose: No external deformity. CV: RRR, No M/G/R. No JVD. No thrill. No extra heart sounds. PULM: CTA B, no wheezes, crackles, rhonchi. No retractions. No resp. distress. No accessory muscle use. ABD: S, NT, ND,  +BS. No rebound. No HSM. EXTR: No c/c/e NEURO Normal gait.  PSYCH: Normally interactive. Conversant. Not depressed or anxious appearing.  Calm demeanor.    Assessment and Plan: Physical exam  Screening for hyperlipidemia - Plan: Lipid panel  Screening for diabetes mellitus - Plan: Comprehensive metabolic panel, Hemoglobin A1c  Screening for deficiency anemia - Plan: CBC  Menopausal symptoms - Plan: VAGIFEM 10 MCG TABS vaginal tablet  Immunization due - Plan: Pneumococcal conjugate vaccine 13-valent IM  Here today for a physical exam.  Doing well overall Labs pending as above Recommended Shingrix Gave Prevnar, flu shot today Refill Vagifem low dose vaginal estrogen tablet  Signed Lamar Blinks, MD  Received her labs, letter to pt  Results for orders placed or performed in visit on 05/29/19  CBC  Result Value Ref Range   WBC 7.0 4.0 - 10.5 K/uL   RBC 5.05 3.87 - 5.11 Mil/uL   Platelets 190.0 150.0 - 400.0 K/uL   Hemoglobin 15.0 12.0 - 15.0 g/dL   HCT 45.2 36.0 - 46.0 %   MCV 89.5 78.0 - 100.0 fl   MCHC 33.1 30.0 - 36.0 g/dL   RDW 13.3 11.5 - 15.5 %  Comprehensive metabolic panel  Result Value Ref Range   Sodium 139 135 - 145 mEq/L   Potassium 4.9 3.5 - 5.1 mEq/L   Chloride 103 96 - 112 mEq/L   CO2 30 19 - 32 mEq/L   Glucose, Bld 102 (H) 70 - 99 mg/dL   BUN 13 6 - 23 mg/dL   Creatinine, Ser 0.86 0.40 - 1.20 mg/dL   Total Bilirubin 1.2 0.2 - 1.2 mg/dL   Alkaline Phosphatase 65 39 - 117 U/L   AST 21 0 - 37 U/L   ALT 17 0 - 35 U/L   Total Protein 7.2 6.0 - 8.3 g/dL   Albumin 4.4 3.5 - 5.2 g/dL   Calcium 9.9 8.4 - 10.5 mg/dL   GFR 65.45 >60.00 mL/min  Hemoglobin A1c  Result Value Ref Range   Hgb A1c MFr Bld 5.9 4.6 - 6.5 %  Lipid panel  Result Value Ref Range   Cholesterol 179 0 - 200 mg/dL   Triglycerides 86.0 0.0 - 149.0 mg/dL   HDL 52.80 >39.00 mg/dL   VLDL 17.2 0.0 - 40.0 mg/dL   LDL Cholesterol 109 (H) 0 - 99 mg/dL   Total CHOL/HDL Ratio 3     NonHDL 125.88

## 2019-05-27 NOTE — Patient Instructions (Signed)
It was nice to see you again today, take care.  I would suggest that you have the Shingrix vaccine for shingles, given at your drugstore  Flu shot and pneumonia vaccine given today- we will give you the 2nd pneumonia shot next year  Continue to stay active and take good care of yourself! I will be in touch with your labs asap  Health Maintenance After Age 69 After age 76, you are at a higher risk for certain long-term diseases and infections as well as injuries from falls. Falls are a major cause of broken bones and head injuries in people who are older than age 28. Getting regular preventive care can help to keep you healthy and well. Preventive care includes getting regular testing and making lifestyle changes as recommended by your health care provider. Talk with your health care provider about:  Which screenings and tests you should have. A screening is a test that checks for a disease when you have no symptoms.  A diet and exercise plan that is right for you. What should I know about screenings and tests to prevent falls? Screening and testing are the best ways to find a health problem early. Early diagnosis and treatment give you the best chance of managing medical conditions that are common after age 28. Certain conditions and lifestyle choices may make you more likely to have a fall. Your health care provider may recommend:  Regular vision checks. Poor vision and conditions such as cataracts can make you more likely to have a fall. If you wear glasses, make sure to get your prescription updated if your vision changes.  Medicine review. Work with your health care provider to regularly review all of the medicines you are taking, including over-the-counter medicines. Ask your health care provider about any side effects that may make you more likely to have a fall. Tell your health care provider if any medicines that you take make you feel dizzy or sleepy.  Osteoporosis screening. Osteoporosis  is a condition that causes the bones to get weaker. This can make the bones weak and cause them to break more easily.  Blood pressure screening. Blood pressure changes and medicines to control blood pressure can make you feel dizzy.  Strength and balance checks. Your health care provider may recommend certain tests to check your strength and balance while standing, walking, or changing positions.  Foot health exam. Foot pain and numbness, as well as not wearing proper footwear, can make you more likely to have a fall.  Depression screening. You may be more likely to have a fall if you have a fear of falling, feel emotionally low, or feel unable to do activities that you used to do.  Alcohol use screening. Using too much alcohol can affect your balance and may make you more likely to have a fall. What actions can I take to lower my risk of falls? General instructions  Talk with your health care provider about your risks for falling. Tell your health care provider if: ? You fall. Be sure to tell your health care provider about all falls, even ones that seem minor. ? You feel dizzy, sleepy, or off-balance.  Take over-the-counter and prescription medicines only as told by your health care provider. These include any supplements.  Eat a healthy diet and maintain a healthy weight. A healthy diet includes low-fat dairy products, low-fat (lean) meats, and fiber from whole grains, beans, and lots of fruits and vegetables. Home safety  Remove any tripping hazards,  such as rugs, cords, and clutter.  Install safety equipment such as grab bars in bathrooms and safety rails on stairs.  Keep rooms and walkways well-lit. Activity   Follow a regular exercise program to stay fit. This will help you maintain your balance. Ask your health care provider what types of exercise are appropriate for you.  If you need a cane or walker, use it as recommended by your health care provider.  Wear supportive  shoes that have nonskid soles. Lifestyle  Do not drink alcohol if your health care provider tells you not to drink.  If you drink alcohol, limit how much you have: ? 0-1 drink a day for women. ? 0-2 drinks a day for men.  Be aware of how much alcohol is in your drink. In the [place] one drink equals one typical bottle of beer (12 oz), one-half glass of wine (5 oz), or one shot of hard liquor (1 oz).  Do not use any products that contain nicotine or tobacco, such as cigarettes and e-cigarettes. If you need help quitting, ask your health care provider. Summary  Having a healthy lifestyle and getting preventive care can help to protect your health and wellness after age 65.  Screening and testing are the best way to find a health problem early and help you avoid having a fall. Early diagnosis and treatment give you the best chance for managing medical conditions that are more common for people who are older than age 50.  Falls are a major cause of broken bones and head injuries in people who are older than age 71. Take precautions to prevent a fall at home.  Work with your health care provider to learn what changes you can make to improve your health and wellness and to prevent falls. This information is not intended to replace advice given to you by your health care provider. Make sure you discuss any questions you have with your health care provider. Document Released: 06/23/2017 Document Revised: 12/01/2018 Document Reviewed: 06/23/2017 Elsevier Patient Education  2020 [employer].  I will be in touch with your labs ASAP

## 2019-05-29 ENCOUNTER — Other Ambulatory Visit: Payer: Self-pay

## 2019-05-29 ENCOUNTER — Ambulatory Visit (INDEPENDENT_AMBULATORY_CARE_PROVIDER_SITE_OTHER): Payer: Medicare HMO | Admitting: Family Medicine

## 2019-05-29 ENCOUNTER — Encounter: Payer: Self-pay | Admitting: Family Medicine

## 2019-05-29 VITALS — BP 122/88 | HR 73 | Temp 97.8°F | Resp 16 | Ht 66.0 in | Wt 167.0 lb

## 2019-05-29 DIAGNOSIS — N951 Menopausal and female climacteric states: Secondary | ICD-10-CM | POA: Diagnosis not present

## 2019-05-29 DIAGNOSIS — Z0000 Encounter for general adult medical examination without abnormal findings: Secondary | ICD-10-CM

## 2019-05-29 DIAGNOSIS — Z23 Encounter for immunization: Secondary | ICD-10-CM | POA: Diagnosis not present

## 2019-05-29 DIAGNOSIS — Z130 Encounter for screening for diseases of the blood and blood-forming organs and certain disorders involving the immune mechanism: Secondary | ICD-10-CM | POA: Diagnosis not present

## 2019-05-29 DIAGNOSIS — Z13220 Encounter for screening for lipoid disorders: Secondary | ICD-10-CM | POA: Diagnosis not present

## 2019-05-29 DIAGNOSIS — Z131 Encounter for screening for diabetes mellitus: Secondary | ICD-10-CM | POA: Diagnosis not present

## 2019-05-29 LAB — CBC
HCT: 45.2 % (ref 36.0–46.0)
Hemoglobin: 15 g/dL (ref 12.0–15.0)
MCHC: 33.1 g/dL (ref 30.0–36.0)
MCV: 89.5 fl (ref 78.0–100.0)
Platelets: 190 10*3/uL (ref 150.0–400.0)
RBC: 5.05 Mil/uL (ref 3.87–5.11)
RDW: 13.3 % (ref 11.5–15.5)
WBC: 7 10*3/uL (ref 4.0–10.5)

## 2019-05-29 LAB — COMPREHENSIVE METABOLIC PANEL
ALT: 17 U/L (ref 0–35)
AST: 21 U/L (ref 0–37)
Albumin: 4.4 g/dL (ref 3.5–5.2)
Alkaline Phosphatase: 65 U/L (ref 39–117)
BUN: 13 mg/dL (ref 6–23)
CO2: 30 mEq/L (ref 19–32)
Calcium: 9.9 mg/dL (ref 8.4–10.5)
Chloride: 103 mEq/L (ref 96–112)
Creatinine, Ser: 0.86 mg/dL (ref 0.40–1.20)
GFR: 65.45 mL/min (ref >60.00)
Glucose, Bld: 102 mg/dL — ABNORMAL HIGH (ref 70–99)
Potassium: 4.9 mEq/L (ref 3.5–5.1)
Sodium: 139 mEq/L (ref 135–145)
Total Bilirubin: 1.2 mg/dL (ref 0.2–1.2)
Total Protein: 7.2 g/dL (ref 6.0–8.3)

## 2019-05-29 LAB — HEMOGLOBIN A1C: Hgb A1c MFr Bld: 5.9 % (ref 4.6–6.5)

## 2019-05-29 LAB — LIPID PANEL
Cholesterol: 179 mg/dL (ref 0–200)
HDL: 52.8 mg/dL (ref >39.00)
LDL Cholesterol: 109 mg/dL — ABNORMAL HIGH (ref 0–99)
NonHDL: 125.88
Total CHOL/HDL Ratio: 3
Triglycerides: 86 mg/dL (ref 0.0–149.0)
VLDL: 17.2 mg/dL (ref 0.0–40.0)

## 2019-05-29 MED ORDER — VAGIFEM 10 MCG VA TABS: ORAL_TABLET | VAGINAL | 3 refills | Status: DC

## 2019-06-09 DIAGNOSIS — L820 Inflamed seborrheic keratosis: Secondary | ICD-10-CM | POA: Diagnosis not present

## 2019-06-09 DIAGNOSIS — L814 Other melanin hyperpigmentation: Secondary | ICD-10-CM | POA: Diagnosis not present

## 2019-06-09 DIAGNOSIS — L821 Other seborrheic keratosis: Secondary | ICD-10-CM | POA: Diagnosis not present

## 2019-06-09 DIAGNOSIS — L570 Actinic keratosis: Secondary | ICD-10-CM | POA: Diagnosis not present

## 2019-06-09 DIAGNOSIS — Z85828 Personal history of other malignant neoplasm of skin: Secondary | ICD-10-CM | POA: Diagnosis not present

## 2019-06-09 DIAGNOSIS — D1801 Hemangioma of skin and subcutaneous tissue: Secondary | ICD-10-CM | POA: Diagnosis not present

## 2019-06-09 DIAGNOSIS — D692 Other nonthrombocytopenic purpura: Secondary | ICD-10-CM | POA: Diagnosis not present

## 2019-12-08 DIAGNOSIS — D2262 Melanocytic nevi of left upper limb, including shoulder: Secondary | ICD-10-CM | POA: Diagnosis not present

## 2019-12-08 DIAGNOSIS — C44519 Basal cell carcinoma of skin of other part of trunk: Secondary | ICD-10-CM | POA: Diagnosis not present

## 2019-12-08 DIAGNOSIS — L298 Other pruritus: Secondary | ICD-10-CM | POA: Diagnosis not present

## 2019-12-08 DIAGNOSIS — L814 Other melanin hyperpigmentation: Secondary | ICD-10-CM | POA: Diagnosis not present

## 2019-12-08 DIAGNOSIS — Z85828 Personal history of other malignant neoplasm of skin: Secondary | ICD-10-CM | POA: Diagnosis not present

## 2019-12-08 DIAGNOSIS — D1801 Hemangioma of skin and subcutaneous tissue: Secondary | ICD-10-CM | POA: Diagnosis not present

## 2019-12-08 DIAGNOSIS — L821 Other seborrheic keratosis: Secondary | ICD-10-CM | POA: Diagnosis not present

## 2019-12-08 DIAGNOSIS — L570 Actinic keratosis: Secondary | ICD-10-CM | POA: Diagnosis not present

## 2019-12-08 DIAGNOSIS — D2261 Melanocytic nevi of right upper limb, including shoulder: Secondary | ICD-10-CM | POA: Diagnosis not present

## 2019-12-08 DIAGNOSIS — D225 Melanocytic nevi of trunk: Secondary | ICD-10-CM | POA: Diagnosis not present

## 2019-12-14 DIAGNOSIS — C44519 Basal cell carcinoma of skin of other part of trunk: Secondary | ICD-10-CM | POA: Diagnosis not present

## 2019-12-14 DIAGNOSIS — Z85828 Personal history of other malignant neoplasm of skin: Secondary | ICD-10-CM | POA: Diagnosis not present

## 2019-12-31 DIAGNOSIS — R7303 Prediabetes: Secondary | ICD-10-CM | POA: Insufficient documentation

## 2019-12-31 NOTE — Progress Notes (Signed)
[name] at [facility] [place], [place], [place] [address] [phone]  Date:  01/01/2020   Name:  [name]   DOB:  09-08-1950   MRN:  [id number]  PCP:  Darreld Mclean, MD    Chief Complaint: Hand Pain (left inside of hand, knot, one month)   History of Present Illness:  [name] is a 69 y.o. very pleasant female patient who presents with the following:  Patient with previous history of liver abscess and sepsis in 2016, osteopenia and skin cancer Last seen by myself for physical in October at which time she was generally doing well She is followed by dermatology Here today with concern of hand pain/ cyst  COVID-19 vaccine- she declines to get this vaccine Labs are up-to-date  She has noted a knot on the palmar aspect of her left wrist- seems to be getting smaller-there for about 1 month.  It is perhaps minimally tender, but not otherwise bothersome No restriction of her hand motion, no numbness or weakness She did not fall or have any other injury   Her husband died in 08-20-23 of covid 19-he was hospitalized for several days before passing away.  I offered my deepest condolences on this loss He has some left over alprazolam which she has been using once or twice a week when she is having difficulty sleeping.  She notes that she tends to have hot flashes which keep her awake, and also of course her recent bereavement  She wondered if I could prescribe her some alprazolam to use as needed  Patient Active Problem List   Diagnosis Date Noted  . Prediabetes 12/31/2019  . Skin cancer 06/01/2018  . Grief at loss of child 05/24/2017  . Osteopenia 03/10/2017  . Liver abscess 09/13/2014  . Lesion of liver 09/13/2014  . Sepsis ([place]) 09/13/2014    No past medical history on file.  Past Surgical History:  Procedure Laterality Date  . BLADDER SURGERY      Social History   Tobacco Use  . Smoking  status: Never Smoker  . Smokeless tobacco: Never Used  Substance Use Topics  . Alcohol use: No    Alcohol/week: 0.0 standard drinks  . Drug use: No    Family History  Problem Relation Age of Onset  . Healthy Mother   . Healthy Father     No Known Allergies  Medication list has been reviewed and updated.  Current Outpatient Medications on File Prior to Visit  Medication Sig Dispense Refill  . B Complex-C (SUPER B COMPLEX PO) Take 1 tablet by mouth daily.    . Biotin 1000 MCG tablet Take 1,000 mcg by mouth daily.    . calcium-vitamin D 250-100 MG-UNIT per tablet Take 1 tablet by mouth 2 (two) times daily.    . Cinnamon 500 MG TABS Take 1 tablet by mouth daily.    . Flaxseed, Linseed, (FLAXSEED OIL) 1200 MG CAPS Take 1 capsule by mouth daily.    . Lactobacillus (ACIDOPHILUS PROBIOTIC PO) Take 1 tablet by mouth daily.    . Magnesium 250 MG TABS Take 1 tablet by mouth daily.    . Multiple Vitamins-Minerals (CENTRUM SILVER 50+WOMEN PO) Take 1 tablet by mouth daily.    Marland Kitchen Specialty Vitamins Products (ONE-A-DAY BONE STRENGTH PO) Take 1 tablet by mouth daily.    Marland Kitchen UNABLE TO FIND Med Name: Beta Yam 900 cream bid    . UNABLE  TO FIND Take 15 mg by mouth daily. Med Name: Life Extension DHEA    . UNABLE TO FIND 2 (two) times daily. Med Name: Transitions menopause support    . VAGIFEM 10 MCG TABS vaginal tablet Use 1-2 times a week, PV 24 tablet 3   No current facility-administered medications on file prior to visit.    Review of Systems:  As per HPI- otherwise negative.   Physical Examination: Vitals:   01/01/20 1127 01/01/20 1135  BP: (!) 160/92 (!) 142/85  Pulse: 73   Resp: 16   Temp: 97.8 F (36.6 C)   SpO2: 100%    Vitals:   01/01/20 1127  Weight: 155 lb (70.3 kg)  Height: 5\' 6"  (1.676 m)   Body mass index is 25.02 kg/m. Ideal Body Weight: Weight in (lb) to have BMI = 25: 154.6  GEN: no acute distress.  Normal weight, looks well HEENT: Atraumatic, Normocephalic.    Ears and Nose: No external deformity. CV: RRR, No M/G/R. No JVD. No thrill. No extra heart sounds. PULM: CTA B, no wheezes, crackles, rhonchi. No retractions. No resp. distress. No accessory muscle use. EXTR: No c/c/e PSYCH: Normally interactive. Conversant.  She has a small firm nodule on the palmar aspect of her left wrist, consistent with a cyst It is nontender       Assessment and Plan: Ganglion cyst of volar aspect of left wrist  Prediabetes  Hot flashes - Plan: ALPRAZolam (XANAX) 0.5 MG tablet  Adjustment disorder with other symptom - Plan: ALPRAZolam (XANAX) 0.5 MG tablet  Here today with a couple concerns.  She has a small likely ganglion cyst on her left wrist.  At this point it is not overly bothersome to her, we plan to observe and refer to hand surgery if it becomes bothersome  Patient also recently lost her husband, she has been using some leftover alprazolam as needed for sleep.  The dose she has at home is 0.5 mg. I counseled her that she may use alprazolam on occasion, but this can be habit-forming so I would like her to use it sparingly.  Prescribed alprazolam 0.5, #30 for her to use at bedtime as needed.  Cautioned regarding sedation  Moderate medical decision making today  This visit occurred during the SARS-CoV-2 public health emergency.  Safety protocols were in place, including screening questions prior to the visit, additional usage of staff PPE, and extensive cleaning of exam room while observing appropriate contact time as indicated for disinfecting solutions.    Signed Lamar Blinks, MD

## 2020-01-01 ENCOUNTER — Other Ambulatory Visit: Payer: Self-pay

## 2020-01-01 ENCOUNTER — Ambulatory Visit (INDEPENDENT_AMBULATORY_CARE_PROVIDER_SITE_OTHER): Payer: Medicare HMO | Admitting: Family Medicine

## 2020-01-01 ENCOUNTER — Encounter: Payer: Self-pay | Admitting: Family Medicine

## 2020-01-01 VITALS — BP 142/85 | HR 73 | Temp 97.8°F | Resp 16 | Ht 66.0 in | Wt 155.0 lb

## 2020-01-01 DIAGNOSIS — M67432 Ganglion, left wrist: Secondary | ICD-10-CM | POA: Diagnosis not present

## 2020-01-01 DIAGNOSIS — R7303 Prediabetes: Secondary | ICD-10-CM | POA: Diagnosis not present

## 2020-01-01 DIAGNOSIS — R232 Flushing: Secondary | ICD-10-CM | POA: Diagnosis not present

## 2020-01-01 DIAGNOSIS — F4329 Adjustment disorder with other symptoms: Secondary | ICD-10-CM | POA: Diagnosis not present

## 2020-01-01 MED ORDER — ALPRAZOLAM 0.5 MG PO TABS: 0.2500 mg | ORAL_TABLET | Freq: Every evening | ORAL | 0 refills | Status: DC | PRN

## 2020-01-01 NOTE — Patient Instructions (Signed)
It was good to see you today, but I am so sorry for the loss of your husband I prescribed alprazolam for you to use if needed for insomnia and hot flashes- remember this can be habit forming, use sparingly You have a cyst on your wrist- this is unlikely to cause you any problems, but if it becomes painful or bothersome we can send you to have it removed.  Keep me posted

## 2020-01-08 ENCOUNTER — Telehealth: Payer: Self-pay | Admitting: Family Medicine

## 2020-01-08 DIAGNOSIS — M67432 Ganglion, left wrist: Secondary | ICD-10-CM

## 2020-01-08 NOTE — Telephone Encounter (Signed)
Left message with details

## 2020-01-08 NOTE — Telephone Encounter (Signed)
Caller : Jaynia  Call Back # [phone]  Patient is requesting that [facility] refer her to a hand surgeon that will remove her cyst on her hand. Patient states that they discussed this in her last visit.    Please Advise

## 2020-01-18 ENCOUNTER — Ambulatory Visit: Payer: Medicare HMO | Admitting: Orthopaedic Surgery

## 2020-02-08 ENCOUNTER — Ambulatory Visit: Payer: Medicare HMO | Admitting: Orthopaedic Surgery

## 2020-02-08 ENCOUNTER — Encounter: Payer: Self-pay | Admitting: Orthopaedic Surgery

## 2020-02-08 ENCOUNTER — Other Ambulatory Visit: Payer: Self-pay

## 2020-02-08 VITALS — Ht 66.0 in | Wt 160.0 lb

## 2020-02-08 DIAGNOSIS — M67432 Ganglion, left wrist: Secondary | ICD-10-CM | POA: Diagnosis not present

## 2020-02-08 NOTE — Progress Notes (Signed)
Office Visit Note   Patient: [name]           Date of Birth: 1951-06-22           MRN: [id number] Visit Date: 02/08/2020              Requested by: Darreld Mclean, MD [address] [place],  [place] [address] PCP: Darreld Mclean, MD   Assessment & Plan: Visit Diagnoses:  1. Ganglion cyst of volar aspect of left wrist     Plan: Impression is left volar wrist ganglion cyst.  After discussion of treatment options to include observation, aspiration cortisone injection, surgical excision I have recommended just observation for now as overall her symptoms are mild.  She is in agreement with this plan.  We will see her back as needed.  Follow-Up Instructions: Return if symptoms worsen or fail to improve.   Orders:  No orders of the defined types were placed in this encounter.  No orders of the defined types were placed in this encounter.     Procedures: No procedures performed   Clinical Data: No additional findings.   Subjective: Chief Complaint  Patient presents with  . Left Wrist - Pain    [name] is a very pleasant 69 year old female PCP referral from [place] for evaluation of left volar wrist ganglion cyst that fluctuates in size without any known injuries.  It is not really painful.  She just notices that at times it can grow in size.   Review of Systems  Constitutional: Negative.   HENT: Negative.   Eyes: Negative.   Respiratory: Negative.   Cardiovascular: Negative.   Endocrine: Negative.   Musculoskeletal: Negative.   Neurological: Negative.   Hematological: Negative.   Psychiatric/Behavioral: Negative.   All other systems reviewed and are negative.    Objective: Vital Signs: Ht 5\' 6"  (1.676 m)   Wt 160 lb (72.6 kg)   BMI 25.82 kg/m   Physical Exam Vitals and nursing note reviewed.  Constitutional:      Appearance: She is well-developed.  HENT:     Head: Normocephalic and atraumatic.  Pulmonary:     Effort:  Pulmonary effort is normal.  Abdominal:     Palpations: Abdomen is soft.  Musculoskeletal:     Cervical back: Neck supple.  Skin:    General: Skin is warm.     Capillary Refill: Capillary refill takes less than 2 seconds.  Neurological:     Mental Status: She is alert and oriented to person, place, and time.  Psychiatric:        Behavior: Behavior normal.        Thought Content: Thought content normal.        Judgment: Judgment normal.     Ortho Exam Left wrist shows a pea-sized of volar wrist ganglion cyst overlying the FCR tendon at the wrist flexion crease.  There is a strong radial pulse radial to it.  No neurovascular compromise.  No aggressive features. Specialty Comments:  No specialty comments available.  Imaging: No results found.   PMFS History: Patient Active Problem List   Diagnosis Date Noted  . Prediabetes 12/31/2019  . Skin cancer 06/01/2018  . Grief at loss of child 05/24/2017  . Osteopenia 03/10/2017  . Liver abscess 09/13/2014  . Lesion of liver 09/13/2014  . Sepsis ([place]) 09/13/2014   History reviewed. No pertinent past medical history.  Family History  Problem Relation Age of Onset  . Healthy  Mother   . Healthy Father     Past Surgical History:  Procedure Laterality Date  . BLADDER SURGERY     Social History   Occupational History  . Not on file  Tobacco Use  . Smoking status: Never Smoker  . Smokeless tobacco: Never Used  Substance and Sexual Activity  . Alcohol use: No    Alcohol/week: 0.0 standard drinks  . Drug use: No  . Sexual activity: Not on file

## 2020-02-19 DIAGNOSIS — N951 Menopausal and female climacteric states: Secondary | ICD-10-CM | POA: Diagnosis not present

## 2020-02-19 DIAGNOSIS — D519 Vitamin B12 deficiency anemia, unspecified: Secondary | ICD-10-CM | POA: Diagnosis not present

## 2020-02-19 DIAGNOSIS — R519 Headache, unspecified: Secondary | ICD-10-CM | POA: Diagnosis not present

## 2020-02-19 DIAGNOSIS — M2550 Pain in unspecified joint: Secondary | ICD-10-CM | POA: Diagnosis not present

## 2020-02-19 DIAGNOSIS — R413 Other amnesia: Secondary | ICD-10-CM | POA: Diagnosis not present

## 2020-02-19 DIAGNOSIS — E559 Vitamin D deficiency, unspecified: Secondary | ICD-10-CM | POA: Diagnosis not present

## 2020-02-19 DIAGNOSIS — E039 Hypothyroidism, unspecified: Secondary | ICD-10-CM | POA: Diagnosis not present

## 2020-02-19 DIAGNOSIS — M8580 Other specified disorders of bone density and structure, unspecified site: Secondary | ICD-10-CM | POA: Diagnosis not present

## 2020-02-21 ENCOUNTER — Other Ambulatory Visit: Payer: Self-pay | Admitting: Family Medicine

## 2020-02-21 DIAGNOSIS — Z1231 Encounter for screening mammogram for malignant neoplasm of breast: Secondary | ICD-10-CM

## 2020-02-23 ENCOUNTER — Telehealth: Payer: Self-pay | Admitting: Family Medicine

## 2020-02-23 NOTE — Telephone Encounter (Signed)
Left message to return call. Need to know if they need the future order faxed or her report from last years mammogram.

## 2020-02-23 NOTE — Telephone Encounter (Signed)
Patient states she spoke with someone a week ago , asking to send over her [name] ,to [facility] [phone]. Please advise .

## 2020-04-01 ENCOUNTER — Other Ambulatory Visit: Payer: Self-pay

## 2020-04-01 ENCOUNTER — Ambulatory Visit
Admission: RE | Admit: 2020-04-01 | Discharge: 2020-04-01 | Disposition: A | Discharge status: Home / Self Care | Payer: Medicare HMO | Source: Ambulatory Visit | Attending: Family Medicine | Admitting: Family Medicine

## 2020-04-01 DIAGNOSIS — Z1231 Encounter for screening mammogram for malignant neoplasm of breast: Secondary | ICD-10-CM | POA: Diagnosis not present

## 2020-04-08 DIAGNOSIS — H43813 Vitreous degeneration, bilateral: Secondary | ICD-10-CM | POA: Diagnosis not present

## 2020-04-08 DIAGNOSIS — H5200 Hypermetropia, unspecified eye: Secondary | ICD-10-CM | POA: Diagnosis not present

## 2020-04-08 DIAGNOSIS — Z135 Encounter for screening for eye and ear disorders: Secondary | ICD-10-CM | POA: Diagnosis not present

## 2020-04-08 DIAGNOSIS — Z0100 Encounter for examination of eyes and vision without abnormal findings: Secondary | ICD-10-CM | POA: Diagnosis not present

## 2020-04-08 DIAGNOSIS — H2513 Age-related nuclear cataract, bilateral: Secondary | ICD-10-CM | POA: Diagnosis not present

## 2020-04-15 DIAGNOSIS — E559 Vitamin D deficiency, unspecified: Secondary | ICD-10-CM | POA: Diagnosis not present

## 2020-04-15 DIAGNOSIS — G4700 Insomnia, unspecified: Secondary | ICD-10-CM | POA: Diagnosis not present

## 2020-04-15 DIAGNOSIS — R61 Generalized hyperhidrosis: Secondary | ICD-10-CM | POA: Diagnosis not present

## 2020-04-15 DIAGNOSIS — R413 Other amnesia: Secondary | ICD-10-CM | POA: Diagnosis not present

## 2020-04-15 DIAGNOSIS — N951 Menopausal and female climacteric states: Secondary | ICD-10-CM | POA: Diagnosis not present

## 2020-04-15 DIAGNOSIS — E349 Endocrine disorder, unspecified: Secondary | ICD-10-CM | POA: Diagnosis not present

## 2020-04-15 DIAGNOSIS — Z79899 Other long term (current) drug therapy: Secondary | ICD-10-CM | POA: Diagnosis not present

## 2020-04-15 DIAGNOSIS — L658 Other specified nonscarring hair loss: Secondary | ICD-10-CM | POA: Diagnosis not present

## 2020-04-15 DIAGNOSIS — R635 Abnormal weight gain: Secondary | ICD-10-CM | POA: Diagnosis not present

## 2020-05-29 ENCOUNTER — Encounter: Payer: Medicare HMO | Admitting: Family Medicine

## 2020-06-05 DIAGNOSIS — C44321 Squamous cell carcinoma of skin of nose: Secondary | ICD-10-CM | POA: Diagnosis not present

## 2020-06-05 DIAGNOSIS — Z85828 Personal history of other malignant neoplasm of skin: Secondary | ICD-10-CM | POA: Diagnosis not present

## 2020-06-05 DIAGNOSIS — L820 Inflamed seborrheic keratosis: Secondary | ICD-10-CM | POA: Diagnosis not present

## 2020-06-05 DIAGNOSIS — L570 Actinic keratosis: Secondary | ICD-10-CM | POA: Diagnosis not present

## 2020-06-05 DIAGNOSIS — L821 Other seborrheic keratosis: Secondary | ICD-10-CM | POA: Diagnosis not present

## 2020-06-05 DIAGNOSIS — L814 Other melanin hyperpigmentation: Secondary | ICD-10-CM | POA: Diagnosis not present

## 2020-08-20 DIAGNOSIS — L570 Actinic keratosis: Secondary | ICD-10-CM | POA: Diagnosis not present

## 2020-08-20 DIAGNOSIS — Z85828 Personal history of other malignant neoplasm of skin: Secondary | ICD-10-CM | POA: Diagnosis not present

## 2020-08-20 DIAGNOSIS — L821 Other seborrheic keratosis: Secondary | ICD-10-CM | POA: Diagnosis not present

## 2020-10-16 ENCOUNTER — Other Ambulatory Visit: Payer: Self-pay | Admitting: Family Medicine

## 2020-10-16 DIAGNOSIS — Z1231 Encounter for screening mammogram for malignant neoplasm of breast: Secondary | ICD-10-CM

## 2020-12-04 DIAGNOSIS — Z85828 Personal history of other malignant neoplasm of skin: Secondary | ICD-10-CM | POA: Diagnosis not present

## 2020-12-04 DIAGNOSIS — D1801 Hemangioma of skin and subcutaneous tissue: Secondary | ICD-10-CM | POA: Diagnosis not present

## 2020-12-04 DIAGNOSIS — D0462 Carcinoma in situ of skin of left upper limb, including shoulder: Secondary | ICD-10-CM | POA: Diagnosis not present

## 2020-12-04 DIAGNOSIS — L570 Actinic keratosis: Secondary | ICD-10-CM | POA: Diagnosis not present

## 2020-12-04 DIAGNOSIS — L814 Other melanin hyperpigmentation: Secondary | ICD-10-CM | POA: Diagnosis not present

## 2020-12-04 DIAGNOSIS — L821 Other seborrheic keratosis: Secondary | ICD-10-CM | POA: Diagnosis not present

## 2020-12-06 NOTE — Patient Instructions (Signed)
Good to see you again today, I will be in touch with your labs as soon as possible I would recommend getting the shingles vaccine at your drugstore if not done already, and of course the COVID-19 series  I ordered a bone density scan for you- this can be done when you go in for your mammogram I also am not sure why GI wanted to have you come back in 5 years for your colonoscopy (as opposed to 10 years)- perhaps give them a call and inquire- [facility]    Health Maintenance After Age 30 After age 105, you are at a higher risk for certain long-term diseases and infections as well as injuries from falls. Falls are a major cause of broken bones and head injuries in people who are older than age 71. Getting regular preventive care can help to keep you healthy and well. Preventive care includes getting regular testing and making lifestyle changes as recommended by your health care provider. Talk with your health care provider about:  Which screenings and tests you should have. A screening is a test that checks for a disease when you have no symptoms.  A diet and exercise plan that is right for you. What should I know about screenings and tests to prevent falls? Screening and testing are the best ways to find a health problem early. Early diagnosis and treatment give you the best chance of managing medical conditions that are common after age 71. Certain conditions and lifestyle choices may make you more likely to have a fall. Your health care provider may recommend:  Regular vision checks. Poor vision and conditions such as cataracts can make you more likely to have a fall. If you wear glasses, make sure to get your prescription updated if your vision changes.  Medicine review. Work with your health care provider to regularly review all of the medicines you are taking, including over-the-counter medicines. Ask your health care provider about any side effects that may make you more likely to have a fall.  Tell your health care provider if any medicines that you take make you feel dizzy or sleepy.  Osteoporosis screening. Osteoporosis is a condition that causes the bones to get weaker. This can make the bones weak and cause them to break more easily.  Blood pressure screening. Blood pressure changes and medicines to control blood pressure can make you feel dizzy.  Strength and balance checks. Your health care provider may recommend certain tests to check your strength and balance while standing, walking, or changing positions.  Foot health exam. Foot pain and numbness, as well as not wearing proper footwear, can make you more likely to have a fall.  Depression screening. You may be more likely to have a fall if you have a fear of falling, feel emotionally low, or feel unable to do activities that you used to do.  Alcohol use screening. Using too much alcohol can affect your balance and may make you more likely to have a fall. What actions can I take to lower my risk of falls? General instructions  Talk with your health care provider about your risks for falling. Tell your health care provider if: ? You fall. Be sure to tell your health care provider about all falls, even ones that seem minor. ? You feel dizzy, sleepy, or off-balance.  Take over-the-counter and prescription medicines only as told by your health care provider. These include any supplements.  Eat a healthy diet and maintain a healthy weight.  A healthy diet includes low-fat dairy products, low-fat (lean) meats, and fiber from whole grains, beans, and lots of fruits and vegetables. Home safety  Remove any tripping hazards, such as rugs, cords, and clutter.  Install safety equipment such as grab bars in bathrooms and safety rails on stairs.  Keep rooms and walkways well-lit. Activity  Follow a regular exercise program to stay fit. This will help you maintain your balance. Ask your health care provider what types of exercise  are appropriate for you.  If you need a cane or walker, use it as recommended by your health care provider.  Wear supportive shoes that have nonskid soles.   Lifestyle  Do not drink alcohol if your health care provider tells you not to drink.  If you drink alcohol, limit how much you have: ? 0-1 drink a day for women. ? 0-2 drinks a day for men.  Be aware of how much alcohol is in your drink. In the [place] one drink equals one typical bottle of beer (12 oz), one-half glass of wine (5 oz), or one shot of hard liquor (1 oz).  Do not use any products that contain nicotine or tobacco, such as cigarettes and e-cigarettes. If you need help quitting, ask your health care provider. Summary  Having a healthy lifestyle and getting preventive care can help to protect your health and wellness after age 71.  Screening and testing are the best way to find a health problem early and help you avoid having a fall. Early diagnosis and treatment give you the best chance for managing medical conditions that are more common for people who are older than age 71.  Falls are a major cause of broken bones and head injuries in people who are older than age 71. Take precautions to prevent a fall at home.  Work with your health care provider to learn what changes you can make to improve your health and wellness and to prevent falls. This information is not intended to replace advice given to you by your health care provider. Make sure you discuss any questions you have with your health care provider. Document Revised: 12/01/2018 Document Reviewed: 06/23/2017 Elsevier Patient Education  2021 [employer].

## 2020-12-06 NOTE — Progress Notes (Addendum)
[facility] [address], [place], [place] [address] [phone]  Date:  12/09/2020   Name:  [name]   DOB:  1951/07/19   MRN:  [id number]  PCP:  Darreld Mclean, MD    Chief Complaint: Annual Exam   History of Present Illness:  [name] is a 70 y.o. very pleasant female patient who presents with the following:  Here today for a CPE about 1 year ago Last seen by myself about one year ago Patient with previous history of liver abscess and sepsis in 2016, osteopenia and skin cancer  She is followed by dermatology, Dr. Elvera Lennox for skin cancer Her husband died in December 5102 from complications of [id number] gave her some alprazolam at that time to use as needed She is now on HRT and notes that she is sleeping better. She is getting this through a "med spa" of some sort in [place], she notes that they "check my blood and then inject me with what I need" I did advised her to exercise caution as I am not sure what this means.  She is not able to recall the name of her treatment center  COVID-19 series- declines Pneumonia vaccine- declines She is fasting this am  Mammogram scheduled for August Bone density scan 2019, can update-order to be done with upcoming mammogram Most recent labs October 2020, update today  The ganglion cyst on her wrist went away -no longer bothersome  She denies any chest pain, shortness of breath, postmenopausal bleeding Patient Active Problem List   Diagnosis Date Noted  . Prediabetes 12/31/2019  . Skin cancer 06/01/2018  . Grief at loss of child 05/24/2017  . Osteopenia 03/10/2017  . Liver abscess 09/13/2014  . Lesion of liver 09/13/2014  . Sepsis ([place]) 09/13/2014    No past medical history on file.  Past Surgical History:  Procedure Laterality Date  . BLADDER SURGERY      Social History   Tobacco Use  . Smoking status: Never Smoker  . Smokeless  tobacco: Never Used  Substance Use Topics  . Alcohol use: No    Alcohol/week: 0.0 standard drinks  . Drug use: No    Family History  Problem Relation Age of Onset  . Healthy Mother   . Healthy Father     No Known Allergies  Medication list has been reviewed and updated.  Current Outpatient Medications on File Prior to Visit  Medication Sig Dispense Refill  . ALPRAZolam (XANAX) 0.5 MG tablet Take 0.5-1 tablets (0.25-0.5 mg total) by mouth at bedtime as needed for anxiety. 30 tablet 0  . B Complex-C (SUPER B COMPLEX PO) Take 1 tablet by mouth daily.    . Biotin 1000 MCG tablet Take 1,000 mcg by mouth daily.    . calcium-vitamin D 250-100 MG-UNIT per tablet Take 1 tablet by mouth 2 (two) times daily.    . Cinnamon 500 MG TABS Take 1 tablet by mouth daily.    . Flaxseed, Linseed, (FLAXSEED OIL) 1200 MG CAPS Take 1 capsule by mouth daily.    . Lactobacillus (ACIDOPHILUS PROBIOTIC PO) Take 1 tablet by mouth daily.    . Magnesium 250 MG TABS Take 1 tablet by mouth daily.    . Multiple Vitamins-Minerals (CENTRUM SILVER 50+WOMEN PO) Take 1 tablet by mouth daily.    Marland Kitchen Specialty Vitamins Products (ONE-A-DAY BONE STRENGTH PO) Take 1 tablet by mouth daily.    Marland Kitchen  UNABLE TO FIND Med Name: Beta Yam 900 cream bid    . UNABLE TO FIND Take 15 mg by mouth daily. Med Name: Life Extension DHEA    . UNABLE TO FIND 2 (two) times daily. Med Name: Transitions menopause support    . VAGIFEM 10 MCG TABS vaginal tablet Use 1-2 times a week, PV 24 tablet 3   No current facility-administered medications on file prior to visit.    Review of Systems:  As per HPI- otherwise negative.   Physical Examination: Vitals:   12/09/20 0813  BP: 124/82  Pulse: 69  Resp: 17  Temp: 98.4 F (36.9 C)  SpO2: 98%   Vitals:   12/09/20 0813  Weight: 154 lb (69.9 kg)  Height: 5\' 6"  (1.676 m)   Body mass index is 24.86 kg/m. Ideal Body Weight: Weight in (lb) to have BMI = 25: 154.6  GEN: no acute distress.   Normal weight, looks well HEENT: Atraumatic, Normocephalic.  Bilateral TM wnl, oropharynx normal.  PEERL,EOMI.   Ears and Nose: No external deformity. CV: RRR, No M/G/R. No JVD. No thrill. No extra heart sounds. PULM: CTA B, no wheezes, crackles, rhonchi. No retractions. No resp. distress. No accessory muscle use. ABD: S, NT, ND, +BS. No rebound. No HSM. EXTR: No c/c/e PSYCH: Normally interactive. Conversant.    Assessment and Plan: Physical exam  Prediabetes - Plan: Comprehensive metabolic panel, Hemoglobin A1c  Screening for hyperlipidemia - Plan: Lipid panel  Screening for deficiency anemia - Plan: CBC  Fatigue, unspecified type - Plan: TSH, VITAMIN D 25 Hydroxy (Vit-D Deficiency, Fractures)  Estrogen deficiency - Plan: DG Bone Density  Here today for physical exam.  Labs are pending as above She declines recommended immunizations at this time Mammogram is planned, ordered bone density scan There are some question on her colonoscopy.  She was given a 5-year follow-up in 2016, but we are not sure why she did not get 10 years.  I have asked her to contact her gastroenterologist to clarify Encouraged healthy diet and exercise routine This visit occurred during the SARS-CoV-2 public health emergency.  Safety protocols were in place, including screening questions prior to the visit, additional usage of staff PPE, and extensive cleaning of exam room while observing appropriate contact time as indicated for disinfecting solutions.    Signed Lamar Blinks, MD  Received her labs as follows- called pt as vitamin D too high.  Otherwise labs are ok   Results for orders placed or performed in visit on 12/09/20  CBC  Result Value Ref Range   WBC 6.4 4.0 - 10.5 K/uL   RBC 4.78 3.87 - 5.11 Mil/uL   Platelets 159.0 150.0 - 400.0 K/uL   Hemoglobin 14.5 12.0 - 15.0 g/dL   HCT 43.9 36.0 - 46.0 %   MCV 91.8 78.0 - 100.0 fl   MCHC 33.1 30.0 - 36.0 g/dL   RDW 13.1 11.5 - 15.5 %   Comprehensive metabolic panel  Result Value Ref Range   Sodium 138 135 - 145 mEq/L   Potassium 4.8 3.5 - 5.1 mEq/L   Chloride 102 96 - 112 mEq/L   CO2 31 19 - 32 mEq/L   Glucose, Bld 94 70 - 99 mg/dL   BUN 15 6 - 23 mg/dL   Creatinine, Ser 0.83 0.40 - 1.20 mg/dL   Total Bilirubin 0.6 0.2 - 1.2 mg/dL   Alkaline Phosphatase 64 39 - 117 U/L   AST 19 0 - 37 U/L   ALT  13 0 - 35 U/L   Total Protein 6.7 6.0 - 8.3 g/dL   Albumin 4.0 3.5 - 5.2 g/dL   GFR 71.44 >60.00 mL/min   Calcium 9.2 8.4 - 10.5 mg/dL  Hemoglobin A1c  Result Value Ref Range   Hgb A1c MFr Bld 5.4 4.6 - 6.5 %  Lipid panel  Result Value Ref Range   Cholesterol 166 0 - 200 mg/dL   Triglycerides 67.0 0.0 - 149.0 mg/dL   HDL 52.40 >39.00 mg/dL   VLDL 13.4 0.0 - 40.0 mg/dL   LDL Cholesterol 101 (H) 0 - 99 mg/dL   Total CHOL/HDL Ratio 3    NonHDL 114.09   TSH  Result Value Ref Range   TSH 1.59 0.35 - 4.50 uIU/mL  VITAMIN D 25 Hydroxy (Vit-D Deficiency, Fractures)  Result Value Ref Range   VITD 112.99 (HH) 30.00 - 100.00 ng/mL   Called her- Vitamin D is too high.  She is taking 10,000 international units daily.  I advised her elevated vitamin D can cause problems with calcium toxicity.  I asked her to please cut back to 2000 international units daily and she plans to do so Otherwise will send letter to patient with labs

## 2020-12-09 ENCOUNTER — Telehealth: Payer: Self-pay | Admitting: *Deleted

## 2020-12-09 ENCOUNTER — Encounter: Payer: Self-pay | Admitting: Family Medicine

## 2020-12-09 ENCOUNTER — Other Ambulatory Visit: Payer: Self-pay

## 2020-12-09 ENCOUNTER — Ambulatory Visit (INDEPENDENT_AMBULATORY_CARE_PROVIDER_SITE_OTHER): Payer: Medicare HMO | Admitting: Family Medicine

## 2020-12-09 VITALS — BP 124/82 | HR 69 | Temp 98.4°F | Resp 17 | Ht 66.0 in | Wt 154.0 lb

## 2020-12-09 DIAGNOSIS — Z13220 Encounter for screening for lipoid disorders: Secondary | ICD-10-CM | POA: Diagnosis not present

## 2020-12-09 DIAGNOSIS — Z0000 Encounter for general adult medical examination without abnormal findings: Secondary | ICD-10-CM

## 2020-12-09 DIAGNOSIS — Z130 Encounter for screening for diseases of the blood and blood-forming organs and certain disorders involving the immune mechanism: Secondary | ICD-10-CM

## 2020-12-09 DIAGNOSIS — R7303 Prediabetes: Secondary | ICD-10-CM

## 2020-12-09 DIAGNOSIS — E2839 Other primary ovarian failure: Secondary | ICD-10-CM | POA: Diagnosis not present

## 2020-12-09 DIAGNOSIS — R5383 Other fatigue: Secondary | ICD-10-CM | POA: Diagnosis not present

## 2020-12-09 LAB — CBC
HCT: 43.9 % (ref 36.0–46.0)
Hemoglobin: 14.5 g/dL (ref 12.0–15.0)
MCHC: 33.1 g/dL (ref 30.0–36.0)
MCV: 91.8 fl (ref 78.0–100.0)
Platelets: 159 10*3/uL (ref 150.0–400.0)
RBC: 4.78 Mil/uL (ref 3.87–5.11)
RDW: 13.1 % (ref 11.5–15.5)
WBC: 6.4 10*3/uL (ref 4.0–10.5)

## 2020-12-09 LAB — LIPID PANEL
Cholesterol: 166 mg/dL (ref 0–200)
HDL: 52.4 mg/dL (ref >39.00)
LDL Cholesterol: 101 mg/dL — ABNORMAL HIGH (ref 0–99)
NonHDL: 114.09
Total CHOL/HDL Ratio: 3
Triglycerides: 67 mg/dL (ref 0.0–149.0)
VLDL: 13.4 mg/dL (ref 0.0–40.0)

## 2020-12-09 LAB — COMPREHENSIVE METABOLIC PANEL
ALT: 13 U/L (ref 0–35)
AST: 19 U/L (ref 0–37)
Albumin: 4 g/dL (ref 3.5–5.2)
Alkaline Phosphatase: 64 U/L (ref 39–117)
BUN: 15 mg/dL (ref 6–23)
CO2: 31 mEq/L (ref 19–32)
Calcium: 9.2 mg/dL (ref 8.4–10.5)
Chloride: 102 mEq/L (ref 96–112)
Creatinine, Ser: 0.83 mg/dL (ref 0.40–1.20)
GFR: 71.44 mL/min (ref >60.00)
Glucose, Bld: 94 mg/dL (ref 70–99)
Potassium: 4.8 mEq/L (ref 3.5–5.1)
Sodium: 138 mEq/L (ref 135–145)
Total Bilirubin: 0.6 mg/dL (ref 0.2–1.2)
Total Protein: 6.7 g/dL (ref 6.0–8.3)

## 2020-12-09 LAB — HEMOGLOBIN A1C: Hgb A1c MFr Bld: 5.4 % (ref 4.6–6.5)

## 2020-12-09 LAB — VITAMIN D 25 HYDROXY (VIT D DEFICIENCY, FRACTURES): VITD: 112.99 ng/mL (ref 30.00–100.00)

## 2020-12-09 LAB — TSH: TSH: 1.59 u[IU]/mL (ref 0.35–4.50)

## 2020-12-09 NOTE — Telephone Encounter (Signed)
CRITICAL VALUE STICKER  CRITICAL VALUE:  Vit D  112.99  RECEIVER (on-site recipient of call):  Kelle Darting, [place] NOTIFIED:  12/09/20 @ 1:47pm  MESSENGER (representative from lab): Roger Kill  MD NOTIFIED:  [place]  TIME OF NOTIFICATION: 2:06pm  RESPONSE:

## 2020-12-19 ENCOUNTER — Encounter: Payer: Medicare HMO | Admitting: Family Medicine

## 2021-01-08 ENCOUNTER — Other Ambulatory Visit: Payer: Self-pay | Admitting: Family Medicine

## 2021-01-08 DIAGNOSIS — E2839 Other primary ovarian failure: Secondary | ICD-10-CM

## 2021-02-12 ENCOUNTER — Encounter: Payer: Self-pay | Admitting: Family Medicine

## 2021-02-26 ENCOUNTER — Encounter: Payer: Self-pay | Admitting: Family Medicine

## 2021-04-02 ENCOUNTER — Ambulatory Visit
Admission: RE | Admit: 2021-04-02 | Discharge: 2021-04-02 | Disposition: A | Discharge status: Home / Self Care | Payer: Medicare HMO | Source: Ambulatory Visit | Attending: Family Medicine | Admitting: Family Medicine

## 2021-04-02 ENCOUNTER — Other Ambulatory Visit: Payer: Self-pay

## 2021-04-02 DIAGNOSIS — Z1231 Encounter for screening mammogram for malignant neoplasm of breast: Secondary | ICD-10-CM

## 2021-04-14 DIAGNOSIS — Z135 Encounter for screening for eye and ear disorders: Secondary | ICD-10-CM | POA: Diagnosis not present

## 2021-04-14 DIAGNOSIS — H2513 Age-related nuclear cataract, bilateral: Secondary | ICD-10-CM | POA: Diagnosis not present

## 2021-04-14 DIAGNOSIS — H5203 Hypermetropia, bilateral: Secondary | ICD-10-CM | POA: Diagnosis not present

## 2021-04-14 DIAGNOSIS — H43813 Vitreous degeneration, bilateral: Secondary | ICD-10-CM | POA: Diagnosis not present

## 2021-05-21 NOTE — Progress Notes (Addendum)
Subjective:   [name] is a 70 y.o. female who presents for an Initial Medicare Annual Wellness Visit.  I connected with [name] today by telephone and verified that I am speaking with the correct person using two identifiers. Location patient: home Location provider: work Persons participating in the virtual visit: patient, Marine scientist.    I discussed the limitations, risks, security and privacy concerns of performing an evaluation and management service by telephone and the availability of in person appointments. I also discussed with the patient that there may be a patient responsible charge related to this service. The patient expressed understanding and verbally consented to this telephonic visit.    Interactive audio and video telecommunications were attempted between this provider and patient, however failed, due to patient having technical difficulties OR patient did not have access to video capability.  We continued and completed visit with audio only.  Some vital signs may be absent or patient reported.   Time Spent with patient on telephone encounter: 20 minutes   Review of Systems     Cardiac Risk Factors include: advanced age (>5men, >78 women)     Objective:    Today's Vitals   05/22/21 0902  Weight: 154 lb (69.9 kg)  Height: 5\' 6"  (1.676 m)   Body mass index is 24.86 kg/m.  Advanced Directives 05/22/2021 09/13/2014 09/12/2014  Does Patient Have a Medical Advance Directive? Yes Yes No  Type of Advance Directive Living will [place];Living will -  Does patient want to make changes to medical advance directive? - No - Patient declined -  Would patient like information on creating a medical advance directive? - - No - patient declined information    Current Medications (verified) Outpatient Encounter Medications as of 05/22/2021  Medication Sig   ALPRAZolam (XANAX) 0.5 MG tablet Take 0.5-1 tablets (0.25-0.5 mg total) by mouth at bedtime as  needed for anxiety.   B Complex-C (SUPER B COMPLEX PO) Take 1 tablet by mouth daily.   Biotin 1000 MCG tablet Take 1,000 mcg by mouth daily.   calcium-vitamin D 250-100 MG-UNIT per tablet Take 1 tablet by mouth 2 (two) times daily.   Cinnamon 500 MG TABS Take 1 tablet by mouth daily.   Flaxseed, Linseed, (FLAXSEED OIL) 1200 MG CAPS Take 1 capsule by mouth daily.   Lactobacillus (ACIDOPHILUS PROBIOTIC PO) Take 1 tablet by mouth daily.   Magnesium 250 MG TABS Take 1 tablet by mouth daily.   Multiple Vitamins-Minerals (CENTRUM SILVER 50+WOMEN PO) Take 1 tablet by mouth daily.   Specialty Vitamins Products (ONE-A-DAY BONE STRENGTH PO) Take 1 tablet by mouth daily.   UNABLE TO FIND Med Name: Beta Yam 900 cream bid   UNABLE TO FIND Take 15 mg by mouth daily. Med Name: Life Extension DHEA   UNABLE TO FIND 2 (two) times daily. Med Name: Transitions menopause support   No facility-administered encounter medications on file as of 05/22/2021.    Allergies (verified) Patient has no known allergies.   History: History reviewed. No pertinent past medical history. Past Surgical History:  Procedure Laterality Date   BLADDER SURGERY     Family History  Problem Relation Age of Onset   Healthy Mother    Healthy Father    Social History   Socioeconomic History   Marital status: Widowed    Spouse name: Not on file   Number of children: Not on file   Years of education: Not on file   Highest education level:  Not on file  Occupational History   Not on file  Tobacco Use   Smoking status: Never   Smokeless tobacco: Never  Substance and Sexual Activity   Alcohol use: No    Alcohol/week: 0.0 standard drinks   Drug use: No   Sexual activity: Not on file  Other Topics Concern   Not on file  Social History Narrative   Not on file   Social Determinants of Health   Financial Resource Strain: Low Risk    Difficulty of Paying Living Expenses: Not hard at all  Food Insecurity: No Food  Insecurity   Worried About Charity fundraiser in the Last Year: Never true   [place] in the Last Year: Never true  Transportation Needs: No Transportation Needs   Lack of Transportation (Medical): No   Lack of Transportation (Non-Medical): No  Physical Activity: Inactive   Days of Exercise per Week: 0 days   Minutes of Exercise per Session: 0 min  Stress: No Stress Concern Present   Feeling of Stress : Not at all  Social Connections: Moderately Isolated   Frequency of Communication with Friends and Family: More than three times a week   Frequency of Social Gatherings with Friends and Family: More than three times a week   Attends Religious Services: More than 4 times per year   Active Member of [employer] or Organizations: No   Attends Archivist Meetings: Never   Marital Status: Widowed    Tobacco Counseling Counseling given: Not Answered   Clinical Intake:  Pre-visit preparation completed: Yes  Pain : No/denies pain     BMI - recorded: 24.86 Nutritional Status: BMI of 19-24  Normal Nutritional Risks: None Diabetes: No  How often do you need to have someone help you when you read instructions, pamphlets, or other written materials from your doctor or pharmacy?: 1 - Never  Diabetic?No  Interpreter Needed?: No  Information entered by :: Caroleen Hamman LPN   Activities of Daily Living In your present state of health, do you have any difficulty performing the following activities: 05/22/2021  Hearing? N  Vision? N  Difficulty concentrating or making decisions? N  Walking or climbing stairs? N  Dressing or bathing? N  Doing errands, shopping? N  Preparing Food and eating ? N  Using the Toilet? N  In the past six months, have you accidently leaked urine? N  Do you have problems with loss of bowel control? N  Managing your Medications? N  Managing your Finances? N  Housekeeping or managing your Housekeeping? N  Some recent data might be hidden     Patient Care Team: Copland, Gay Filler, MD as PCP - General (Family Medicine)  Indicate any recent Medical Services you may have received from other than [facility] providers in the past year (date may be approximate).     Assessment:   This is a routine wellness examination for [name].  Hearing/Vision screen Hearing Screening - Comments:: No issues Vision Screening - Comments:: Last eye exam-03/2021-[facility]  Dietary issues and exercise activities discussed: Current Exercise Habits: The patient does not participate in regular exercise at present, Exercise limited by: None identified   Goals Addressed             This Visit's Progress    Patient Stated       Maintain current healthy lifestyle       Depression Screen PHQ 2/9 Scores 05/22/2021 12/09/2020 06/04/2016 10/10/2014  PHQ -  2 Score 0 0 0 0    Fall Risk Fall Risk  05/22/2021 12/09/2020 06/04/2016 10/10/2014  Falls in the past year? 0 0 No No  Number falls in past yr: 0 0 - -  Injury with Fall? 0 0 - -  Follow up Falls prevention discussed - - -    FALL RISK PREVENTION PERTAINING TO THE HOME:  Any stairs in or around the home? Yes  If so, are there any without handrails? No  Home free of loose throw rugs in walkways, pet beds, electrical cords, etc? Yes  Adequate lighting in your home to reduce risk of falls? Yes   ASSISTIVE DEVICES UTILIZED TO PREVENT FALLS:  Life alert? No  Use of a cane, walker or w/c? No  Grab bars in the bathroom? Yes  Shower chair or bench in shower? No  Elevated toilet seat or a handicapped toilet? No   TIMED UP AND GO:  Was the test performed? No . Phone visit   Cognitive Function:Normal cognitive status assessed bythis Nurse Health Advisor. No abnormalities found.          Immunizations Immunization History  Administered Date(s) Administered   Fluad Quad(high Dose 65+) 05/29/2019   Influenza, High Dose Seasonal PF 05/26/2018   Pneumococcal Conjugate-13 05/29/2019   Tdap  05/26/2018    TDAP status: Up to date  Flu Vaccine status: Due, Education has been provided regarding the importance of this vaccine. Advised may receive this vaccine at local pharmacy or Health Dept. Aware to provide a copy of the vaccination record if obtained from local pharmacy or Health Dept. Verbalized acceptance and understanding.  Pneumococcal vaccine status: Due, Education has been provided regarding the importance of this vaccine. Advised may receive this vaccine at local pharmacy or Health Dept. Aware to provide a copy of the vaccination record if obtained from local pharmacy or Health Dept. Verbalized acceptance and understanding.  Covid-19 vaccine status: Declined, Education has been provided regarding the importance of this vaccine but patient still declined. Advised may receive this vaccine at local pharmacy or Health Dept.or vaccine clinic. Aware to provide a copy of the vaccination record if obtained from local pharmacy or Health Dept. Verbalized acceptance and understanding.  Qualifies for Shingles Vaccine? Yes   Zostavax completed No   Shingrix Completed?: No.    Education has been provided regarding the importance of this vaccine. Patient has been advised to call insurance company to determine out of pocket expense if they have not yet received this vaccine. Advised may also receive vaccine at local pharmacy or Health Dept. Verbalized acceptance and understanding.  Screening Tests Health Maintenance  Topic Date Due   COLONOSCOPY (Pts 45-41yrs Insurance coverage will need to be confirmed)  11/19/2019   COVID-19 Vaccine (1) 06/07/2021 (Originally 01/14/1952)   Zoster Vaccines- Shingrix (1 of 2) 08/21/2021 (Originally 07/16/1970)   INFLUENZA VACCINE  11/21/2021 (Originally 03/24/2021)   MAMMOGRAM  04/03/2023   TETANUS/TDAP  05/26/2028   DEXA SCAN  Completed   Hepatitis C Screening  Completed   HPV VACCINES  Aged Out    Health Maintenance  Health Maintenance Due  Topic  Date Due   COLONOSCOPY (Pts 45-87yrs Insurance coverage will need to be confirmed)  11/19/2019    Colorectal cancer screening: Due-Patient states she will contact GI when she is ready to schedule.  Mammogram status: Completed bilateral 04/02/2021. Repeat every year  Bone Density status: Scheduled for 06/17/2021  Lung Cancer Screening: (Low Dose CT Chest recommended if Age 90-80  years, 30 pack-year currently smoking OR have quit w/in 15years.) does not qualify.     Additional Screening:  Hepatitis C Screening: Completed 09/15/2014  Vision Screening: Recommended annual ophthalmology exams for early detection of glaucoma and other disorders of the eye. Is the patient up to date with their annual eye exam?  Yes  Who is the provider or what is the name of the office in which the patient attends annual eye exams? [facility]   Dental Screening: Recommended annual dental exams for proper oral hygiene  Community Resource Referral / Chronic Care Management: CRR required this visit?  No   CCM required this visit?  No      Plan:     I have personally reviewed and noted the following in the patient's chart:   Medical and social history Use of alcohol, tobacco or illicit drugs  Current medications and supplements including opioid prescriptions. Patient is not currently taking opioid prescriptions. Functional ability and status Nutritional status Physical activity Advanced directives List of other physicians Hospitalizations, surgeries, and ER visits in previous 12 months Vitals Screenings to include cognitive, depression, and falls Referrals and appointments  In addition, I have reviewed and discussed with patient certain preventive protocols, quality metrics, and best practice recommendations. A written personalized care plan for preventive services as well as general preventive health recommendations were provided to patient.   Due to this being a telephonic visit, the after  visit summary with patients personalized plan was offered to patient via mail or my-chart. Patient would like to access on my-chart.   Marta Antu, LPN   04/[birth year]91  Nurse Health Advisor  Nurse Notes: None   Medical screening examination/treatment/procedure(s) were performed by non-physician practitioner and as supervising provider I was immediately available for consultation/collaboration.  I agree with above. Marrian Salvage, FNP

## 2021-05-22 ENCOUNTER — Ambulatory Visit (INDEPENDENT_AMBULATORY_CARE_PROVIDER_SITE_OTHER): Payer: Medicare HMO

## 2021-05-22 VITALS — Ht 66.0 in | Wt 154.0 lb

## 2021-05-22 DIAGNOSIS — Z0000 Encounter for general adult medical examination without abnormal findings: Secondary | ICD-10-CM

## 2021-05-22 NOTE — Patient Instructions (Signed)
[name] , Thank you for taking time to come for your Medicare Wellness Visit. I appreciate your ongoing commitment to your health goals. Please review the following plan we discussed and let me know if I can assist you in the future.   Screening recommendations/referrals: Colonoscopy: Due-Per our conversation, you will contact GI when you are ready to schedule. Mammogram: Completed 04/02/2021-Due 04/02/2022 Bone Density: Scheduled for 06/17/2021. Recommended yearly ophthalmology/optometry visit for glaucoma screening and checkup Recommended yearly dental visit for hygiene and checkup  Vaccinations: Influenza vaccine: Declined Pneumococcal vaccine: Declined Tdap vaccine: Up to date-Due-05/26/2028 Shingles vaccine:   Declined Covid-19:Declined  Advanced directives: Please bring a copy for your chart.  Conditions/risks identified: See problem list  Next appointment: Follow up in one year for your annual wellness visit 05/27/2022 @ 10:20   Preventive Care 65 Years and Older, Female Preventive care refers to lifestyle choices and visits with your health care provider that can promote health and wellness. What does preventive care include? A yearly physical exam. This is also called an annual well check. Dental exams once or twice a year. Routine eye exams. Ask your health care provider how often you should have your eyes checked. Personal lifestyle choices, including: Daily care of your teeth and gums. Regular physical activity. Eating a healthy diet. Avoiding tobacco and drug use. Limiting alcohol use. Practicing safe sex. Taking low-dose aspirin every day. Taking vitamin and mineral supplements as recommended by your health care provider. What happens during an annual well check? The services and screenings done by your health care provider during your annual well check will depend on your age, overall health, lifestyle risk factors, and family history of disease. Counseling   Your health care provider may ask you questions about your: Alcohol use. Tobacco use. Drug use. Emotional well-being. Home and relationship well-being. Sexual activity. Eating habits. History of falls. Memory and ability to understand (cognition). Work and work Statistician. Reproductive health. Screening  You may have the following tests or measurements: Height, weight, and BMI. Blood pressure. Lipid and cholesterol levels. These may be checked every 5 years, or more frequently if you are over 27 years old. Skin check. Lung cancer screening. You may have this screening every year starting at age 74 if you have a 30-pack-year history of smoking and currently smoke or have quit within the past 15 years. Fecal occult blood test (FOBT) of the stool. You may have this test every year starting at age 76. Flexible sigmoidoscopy or colonoscopy. You may have a sigmoidoscopy every 5 years or a colonoscopy every 10 years starting at age 43. Hepatitis C blood test. Hepatitis B blood test. Sexually transmitted disease (STD) testing. Diabetes screening. This is done by checking your blood sugar (glucose) after you have not eaten for a while (fasting). You may have this done every 1-3 years. Bone density scan. This is done to screen for osteoporosis. You may have this done starting at age 29. Mammogram. This may be done every 1-2 years. Talk to your health care provider about how often you should have regular mammograms. Talk with your health care provider about your test results, treatment options, and if necessary, the need for more tests. Vaccines  Your health care provider may recommend certain vaccines, such as: Influenza vaccine. This is recommended every year. Tetanus, diphtheria, and acellular pertussis (Tdap, Td) vaccine. You may need a Td booster every 10 years. Zoster vaccine. You may need this after age 26. Pneumococcal 13-valent conjugate (PCV13) vaccine. One dose  is recommended  after age 49. Pneumococcal polysaccharide (PPSV23) vaccine. One dose is recommended after age 44. Talk to your health care provider about which screenings and vaccines you need and how often you need them. This information is not intended to replace advice given to you by your health care provider. Make sure you discuss any questions you have with your health care provider. Document Released: 09/06/2015 Document Revised: 04/29/2016 Document Reviewed: 06/11/2015 Elsevier Interactive Patient Education  2017 [place] Prevention in the Home Falls can cause injuries. They can happen to people of all ages. There are many things you can do to make your home safe and to help prevent falls. What can I do on the outside of my home? Regularly fix the edges of walkways and driveways and fix any cracks. Remove anything that might make you trip as you walk through a door, such as a raised step or threshold. Trim any bushes or trees on the path to your home. Use bright outdoor lighting. Clear any walking paths of anything that might make someone trip, such as rocks or tools. Regularly check to see if handrails are loose or broken. Make sure that both sides of any steps have handrails. Any raised decks and porches should have guardrails on the edges. Have any leaves, snow, or ice cleared regularly. Use sand or salt on walking paths during winter. Clean up any spills in your garage right away. This includes oil or grease spills. What can I do in the bathroom? Use night lights. Install grab bars by the toilet and in the tub and shower. Do not use towel bars as grab bars. Use non-skid mats or decals in the tub or shower. If you need to sit down in the shower, use a plastic, non-slip stool. Keep the floor dry. Clean up any water that spills on the floor as soon as it happens. Remove soap buildup in the tub or shower regularly. Attach bath mats securely with double-sided non-slip rug tape. Do not  have throw rugs and other things on the floor that can make you trip. What can I do in the bedroom? Use night lights. Make sure that you have a light by your bed that is easy to reach. Do not use any sheets or blankets that are too big for your bed. They should not hang down onto the floor. Have a firm chair that has side arms. You can use this for support while you get dressed. Do not have throw rugs and other things on the floor that can make you trip. What can I do in the kitchen? Clean up any spills right away. Avoid walking on wet floors. Keep items that you use a lot in easy-to-reach places. If you need to reach something above you, use a strong step stool that has a grab bar. Keep electrical cords out of the way. Do not use floor polish or wax that makes floors slippery. If you must use wax, use non-skid floor wax. Do not have throw rugs and other things on the floor that can make you trip. What can I do with my stairs? Do not leave any items on the stairs. Make sure that there are handrails on both sides of the stairs and use them. Fix handrails that are broken or loose. Make sure that handrails are as long as the stairways. Check any carpeting to make sure that it is firmly attached to the stairs. Fix any carpet that is loose or worn. Avoid  having throw rugs at the top or bottom of the stairs. If you do have throw rugs, attach them to the floor with carpet tape. Make sure that you have a light switch at the top of the stairs and the bottom of the stairs. If you do not have them, ask someone to add them for you. What else can I do to help prevent falls? Wear shoes that: Do not have high heels. Have rubber bottoms. Are comfortable and fit you well. Are closed at the toe. Do not wear sandals. If you use a stepladder: Make sure that it is fully opened. Do not climb a closed stepladder. Make sure that both sides of the stepladder are locked into place. Ask someone to hold it for  you, if possible. Clearly mark and make sure that you can see: Any grab bars or handrails. First and last steps. Where the edge of each step is. Use tools that help you move around (mobility aids) if they are needed. These include: Canes. Walkers. Scooters. Crutches. Turn on the lights when you go into a dark area. Replace any light bulbs as soon as they burn out. Set up your furniture so you have a clear path. Avoid moving your furniture around. If any of your floors are uneven, fix them. If there are any pets around you, be aware of where they are. Review your medicines with your doctor. Some medicines can make you feel dizzy. This can increase your chance of falling. Ask your doctor what other things that you can do to help prevent falls. This information is not intended to replace advice given to you by your health care provider. Make sure you discuss any questions you have with your health care provider. Document Released: 06/06/2009 Document Revised: 01/16/2016 Document Reviewed: 09/14/2014 Elsevier Interactive Patient Education  2017 [employer].

## 2021-06-09 DIAGNOSIS — Z85828 Personal history of other malignant neoplasm of skin: Secondary | ICD-10-CM | POA: Diagnosis not present

## 2021-06-09 DIAGNOSIS — D225 Melanocytic nevi of trunk: Secondary | ICD-10-CM | POA: Diagnosis not present

## 2021-06-09 DIAGNOSIS — L853 Xerosis cutis: Secondary | ICD-10-CM | POA: Diagnosis not present

## 2021-06-09 DIAGNOSIS — L814 Other melanin hyperpigmentation: Secondary | ICD-10-CM | POA: Diagnosis not present

## 2021-06-09 DIAGNOSIS — L821 Other seborrheic keratosis: Secondary | ICD-10-CM | POA: Diagnosis not present

## 2021-06-09 DIAGNOSIS — L570 Actinic keratosis: Secondary | ICD-10-CM | POA: Diagnosis not present

## 2021-06-09 DIAGNOSIS — L820 Inflamed seborrheic keratosis: Secondary | ICD-10-CM | POA: Diagnosis not present

## 2021-06-10 DIAGNOSIS — E349 Endocrine disorder, unspecified: Secondary | ICD-10-CM | POA: Diagnosis not present

## 2021-06-10 DIAGNOSIS — N951 Menopausal and female climacteric states: Secondary | ICD-10-CM | POA: Diagnosis not present

## 2021-06-10 DIAGNOSIS — G4700 Insomnia, unspecified: Secondary | ICD-10-CM | POA: Diagnosis not present

## 2021-06-10 DIAGNOSIS — R6882 Decreased libido: Secondary | ICD-10-CM | POA: Diagnosis not present

## 2021-06-10 DIAGNOSIS — Z1321 Encounter for screening for nutritional disorder: Secondary | ICD-10-CM | POA: Diagnosis not present

## 2021-06-10 DIAGNOSIS — R635 Abnormal weight gain: Secondary | ICD-10-CM | POA: Diagnosis not present

## 2021-06-10 DIAGNOSIS — Z139 Encounter for screening, unspecified: Secondary | ICD-10-CM | POA: Diagnosis not present

## 2021-06-10 DIAGNOSIS — R413 Other amnesia: Secondary | ICD-10-CM | POA: Diagnosis not present

## 2021-06-10 DIAGNOSIS — R5383 Other fatigue: Secondary | ICD-10-CM | POA: Diagnosis not present

## 2021-06-17 ENCOUNTER — Ambulatory Visit
Admission: RE | Admit: 2021-06-17 | Discharge: 2021-06-17 | Disposition: A | Discharge status: Home / Self Care | Payer: Medicare HMO | Source: Ambulatory Visit | Attending: Family Medicine | Admitting: Family Medicine

## 2021-06-17 ENCOUNTER — Encounter: Payer: Self-pay | Admitting: Family Medicine

## 2021-06-17 ENCOUNTER — Other Ambulatory Visit: Payer: Self-pay

## 2021-06-17 DIAGNOSIS — E2839 Other primary ovarian failure: Secondary | ICD-10-CM

## 2021-06-17 DIAGNOSIS — Z780 Asymptomatic menopausal state: Secondary | ICD-10-CM | POA: Diagnosis not present

## 2021-06-17 DIAGNOSIS — M8589 Other specified disorders of bone density and structure, multiple sites: Secondary | ICD-10-CM | POA: Diagnosis not present

## 2021-07-02 DIAGNOSIS — Z85828 Personal history of other malignant neoplasm of skin: Secondary | ICD-10-CM | POA: Diagnosis not present

## 2021-07-02 DIAGNOSIS — L820 Inflamed seborrheic keratosis: Secondary | ICD-10-CM | POA: Diagnosis not present

## 2021-10-27 IMAGING — MG MM DIGITAL SCREENING BILAT W/ TOMO AND CAD
8 series · 9 of 24 positions shown · non-contrast
Comparison: Previous exam(s).

CLINICAL DATA: Screening.

EXAM:
DIGITAL SCREENING BILATERAL MAMMOGRAM WITH TOMOSYNTHESIS AND CAD
TECHNIQUE: Bilateral screening digital craniocaudal and mediolateral oblique
mammograms were obtained. Bilateral screening digital breast
tomosynthesis was performed. The images were evaluated with
computer-aided detection.

[L MLO synth-2D]
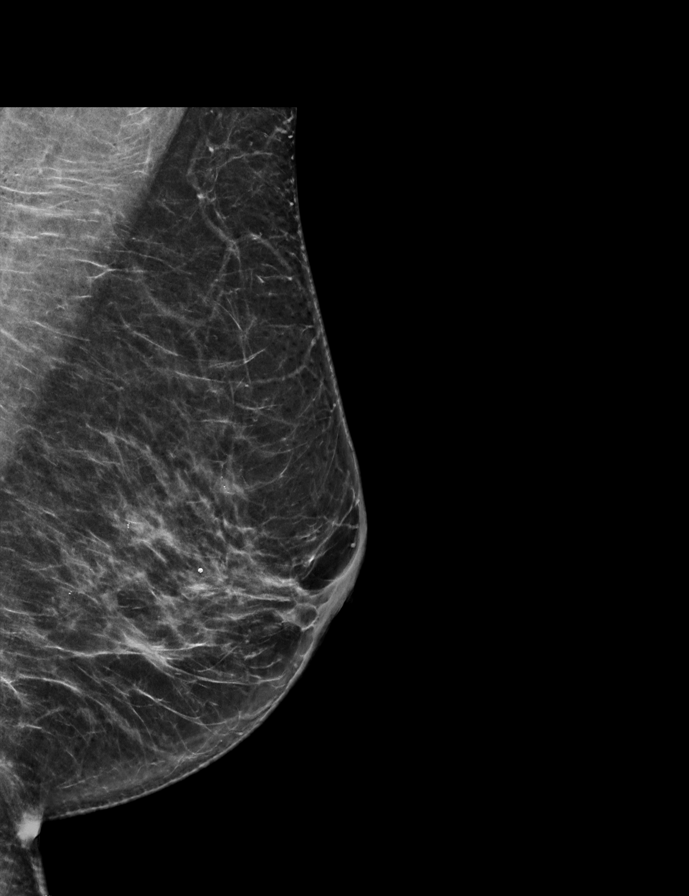

[R CC synth-2D]
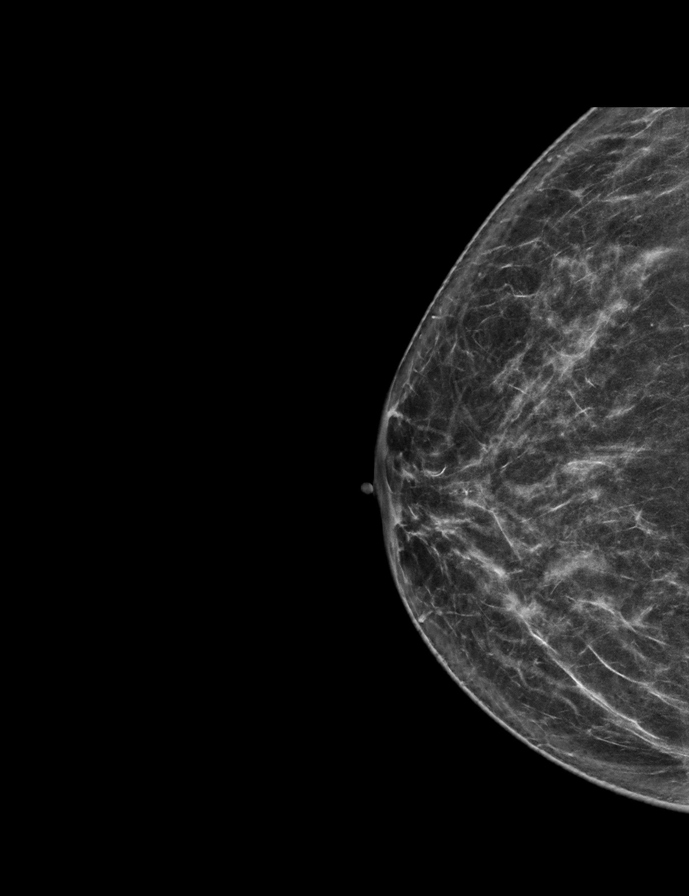

[L CC synth-2D]
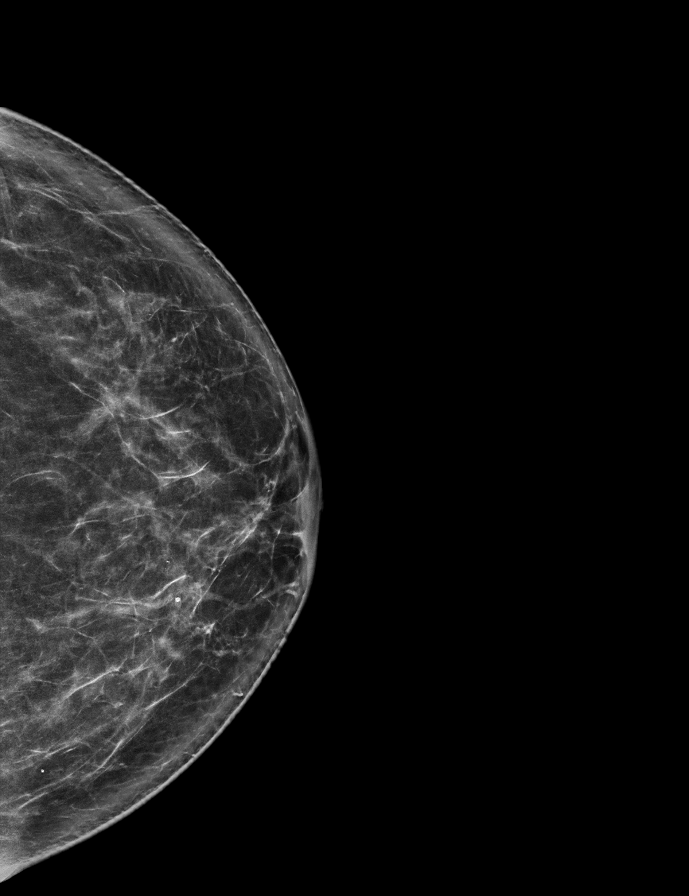

[R MLO synth-2D]
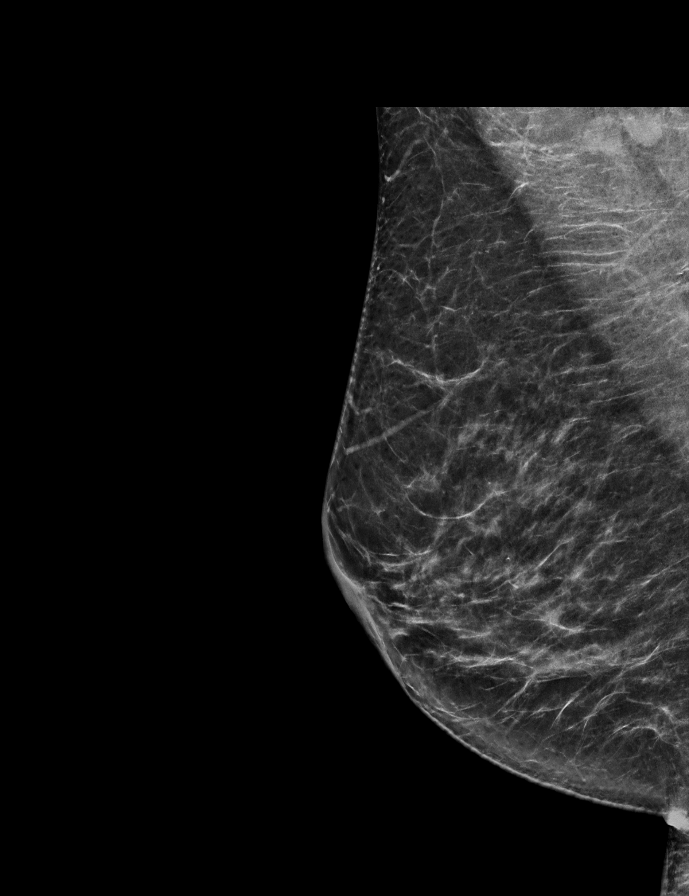

[L CC tomo · 2 of 74 frames shown]
[frame 24/74]
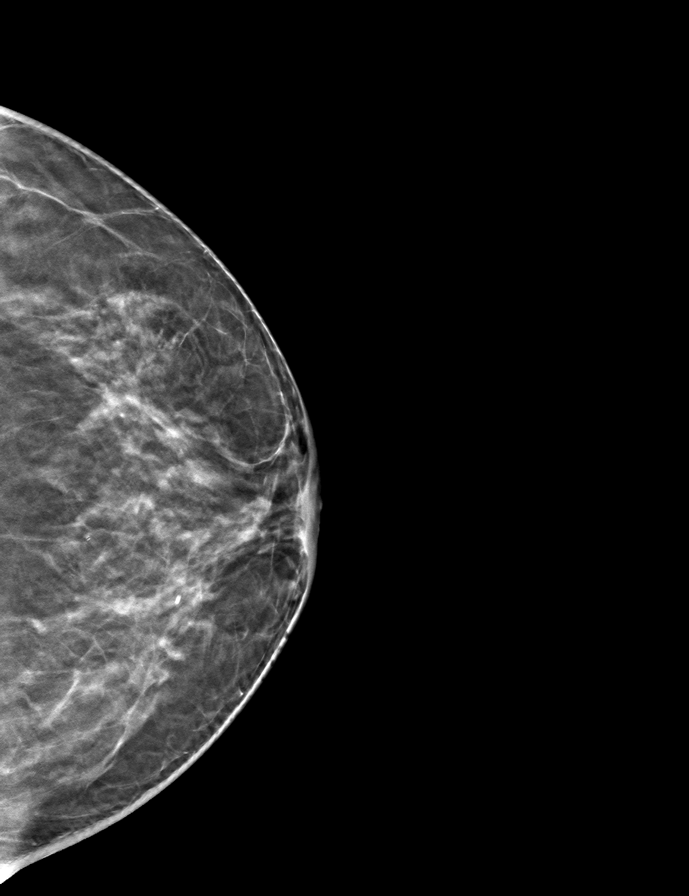
[frame 37/74]
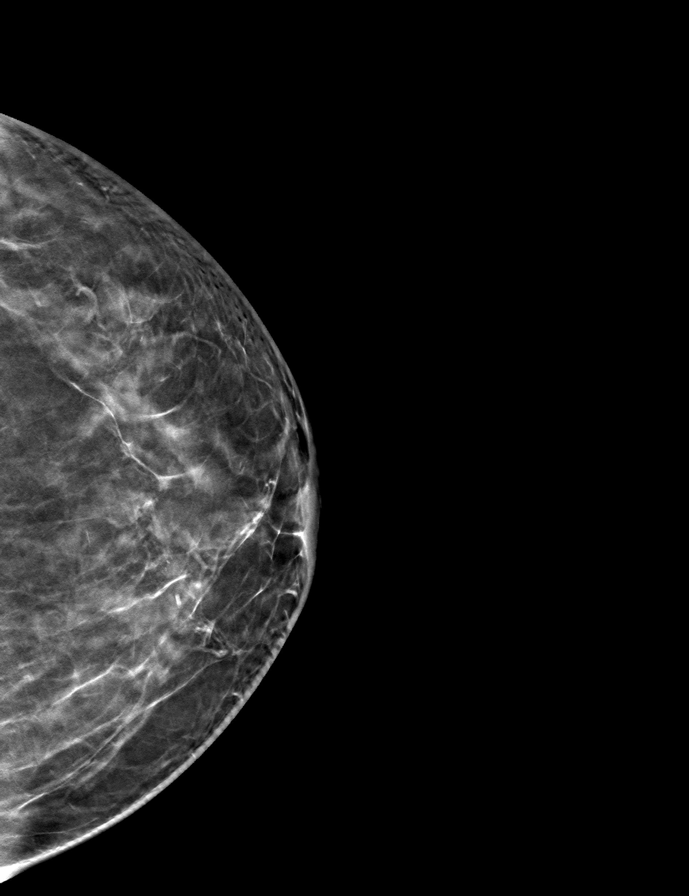

[L MLO tomo · tomo slice 37/74.0]
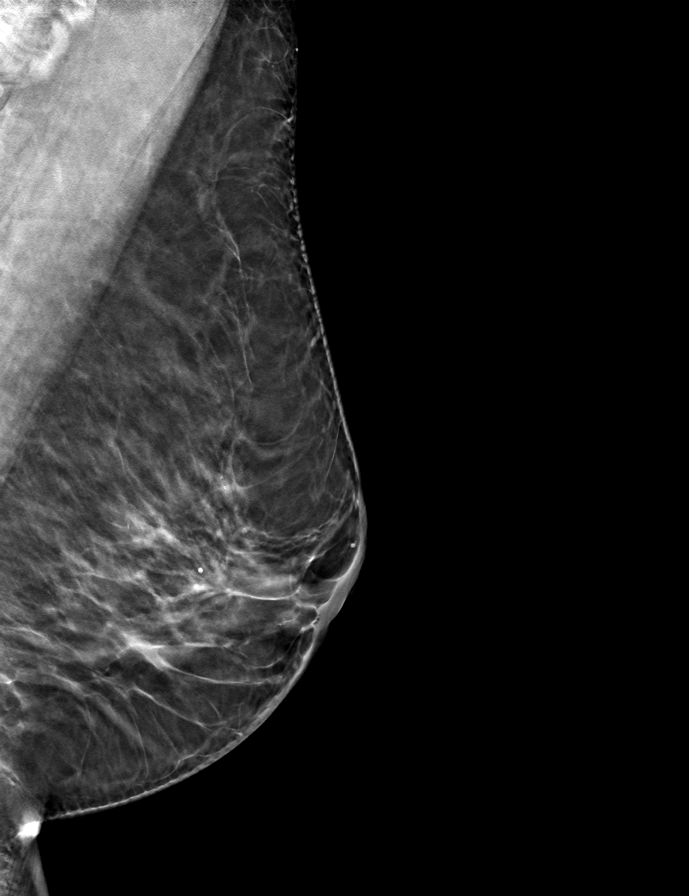

[R MLO tomo · tomo slice 37/72.0]
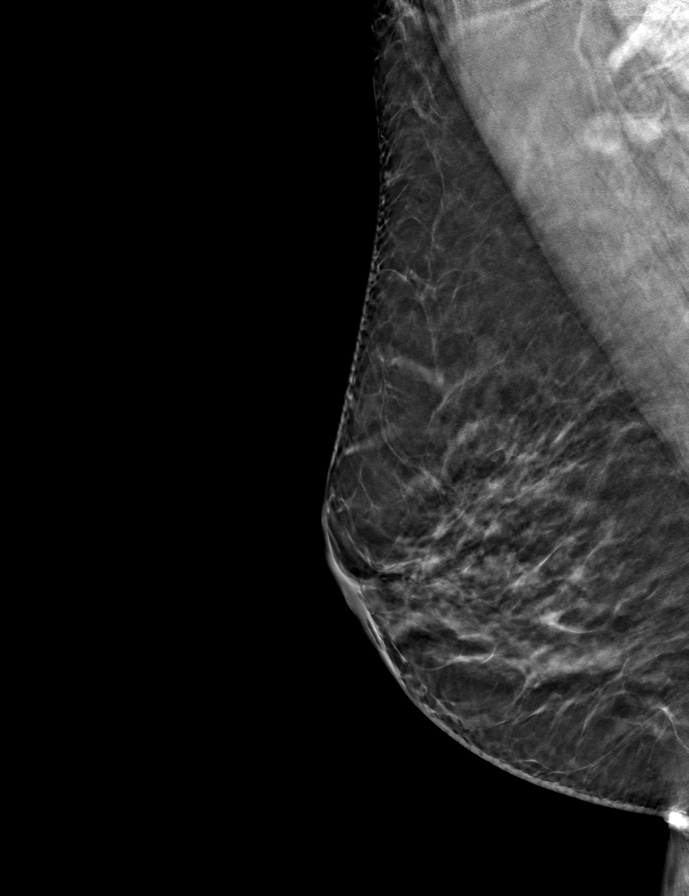

[R CC tomo · tomo slice 35/70.0]
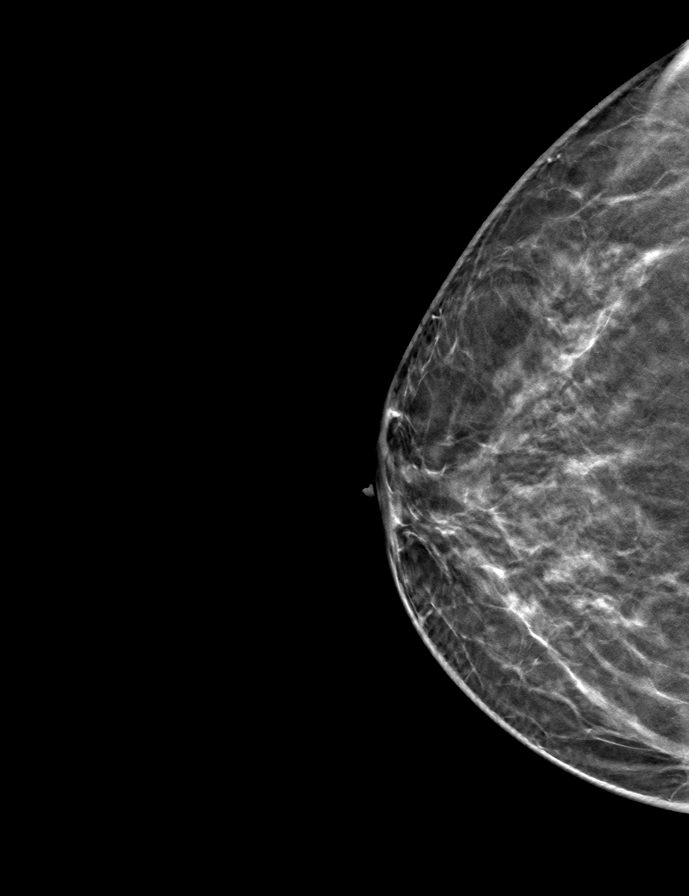

[9 of 24 positions shown; findings below may reference images not displayed]

ACR Breast Density Category c: The breast tissue is heterogeneously
dense, which may obscure small masses.
FINDINGS: There are no findings suspicious for malignancy.
IMPRESSION: No mammographic evidence of malignancy. A result letter of this
screening mammogram will be mailed directly to the patient.

RECOMMENDATION:
Screening mammogram in one year. (Code:Q3-W-BC3)

BI-RADS CATEGORY  1: Negative.

## 2021-12-11 ENCOUNTER — Encounter: Payer: Medicare HMO | Admitting: Family Medicine

## 2021-12-22 ENCOUNTER — Other Ambulatory Visit: Payer: Self-pay | Admitting: Family Medicine

## 2021-12-22 DIAGNOSIS — Z1231 Encounter for screening mammogram for malignant neoplasm of breast: Secondary | ICD-10-CM

## 2022-04-03 ENCOUNTER — Ambulatory Visit
Admission: RE | Admit: 2022-04-03 | Discharge: 2022-04-03 | Disposition: A | Discharge status: Home / Self Care | Payer: Medicare HMO | Source: Ambulatory Visit | Attending: Family Medicine | Admitting: Family Medicine

## 2022-04-03 DIAGNOSIS — Z1231 Encounter for screening mammogram for malignant neoplasm of breast: Secondary | ICD-10-CM

## 2022-04-15 DIAGNOSIS — Z135 Encounter for screening for eye and ear disorders: Secondary | ICD-10-CM | POA: Diagnosis not present

## 2022-04-15 DIAGNOSIS — H2513 Age-related nuclear cataract, bilateral: Secondary | ICD-10-CM | POA: Diagnosis not present

## 2022-04-15 DIAGNOSIS — H43813 Vitreous degeneration, bilateral: Secondary | ICD-10-CM | POA: Diagnosis not present

## 2022-04-15 DIAGNOSIS — H5203 Hypermetropia, bilateral: Secondary | ICD-10-CM | POA: Diagnosis not present

## 2022-04-30 DIAGNOSIS — L820 Inflamed seborrheic keratosis: Secondary | ICD-10-CM | POA: Diagnosis not present

## 2022-04-30 DIAGNOSIS — Z85828 Personal history of other malignant neoplasm of skin: Secondary | ICD-10-CM | POA: Diagnosis not present

## 2022-04-30 DIAGNOSIS — D045 Carcinoma in situ of skin of trunk: Secondary | ICD-10-CM | POA: Diagnosis not present

## 2022-04-30 DIAGNOSIS — L570 Actinic keratosis: Secondary | ICD-10-CM | POA: Diagnosis not present

## 2022-04-30 DIAGNOSIS — L821 Other seborrheic keratosis: Secondary | ICD-10-CM | POA: Diagnosis not present

## 2022-04-30 DIAGNOSIS — D044 Carcinoma in situ of skin of scalp and neck: Secondary | ICD-10-CM | POA: Diagnosis not present

## 2022-04-30 DIAGNOSIS — L814 Other melanin hyperpigmentation: Secondary | ICD-10-CM | POA: Diagnosis not present

## 2022-05-27 ENCOUNTER — Ambulatory Visit: Payer: Medicare HMO

## 2022-08-12 ENCOUNTER — Other Ambulatory Visit: Payer: Self-pay

## 2022-08-12 ENCOUNTER — Ambulatory Visit: Payer: Medicare HMO | Admitting: Nurse Practitioner

## 2022-08-12 ENCOUNTER — Ambulatory Visit (INDEPENDENT_AMBULATORY_CARE_PROVIDER_SITE_OTHER): Payer: Medicare HMO | Admitting: Medical

## 2022-08-12 VITALS — BP 160/80 | HR 96 | Temp 98.0°F | Resp 18 | Ht 66.0 in | Wt 147.8 lb

## 2022-08-12 DIAGNOSIS — R197 Diarrhea, unspecified: Secondary | ICD-10-CM

## 2022-08-12 LAB — CBC WITH DIFFERENTIAL/PLATELET
Basophils Absolute: 0 10*3/uL (ref 0.0–0.1)
Basophils Relative: 0.3 % (ref 0.0–3.0)
Eosinophils Absolute: 0.1 10*3/uL (ref 0.0–0.7)
Eosinophils Relative: 0.8 % (ref 0.0–5.0)
HCT: 42.8 % (ref 36.0–46.0)
Hemoglobin: 14.5 g/dL (ref 12.0–15.0)
Lymphocytes Relative: 15.7 % (ref 12.0–46.0)
Lymphs Abs: 2 10*3/uL (ref 0.7–4.0)
MCHC: 33.8 g/dL (ref 30.0–36.0)
MCV: 89.2 fl (ref 78.0–100.0)
Monocytes Absolute: 1 10*3/uL (ref 0.1–1.0)
Monocytes Relative: 8.1 % (ref 3.0–12.0)
Neutro Abs: 9.4 10*3/uL — ABNORMAL HIGH (ref 1.4–7.7)
Neutrophils Relative %: 75.1 % (ref 43.0–77.0)
Platelets: 215 10*3/uL (ref 150.0–400.0)
RBC: 4.8 Mil/uL (ref 3.87–5.11)
RDW: 13.1 % (ref 11.5–15.5)
WBC: 12.5 10*3/uL — ABNORMAL HIGH (ref 4.0–10.5)

## 2022-08-12 LAB — COMPREHENSIVE METABOLIC PANEL
ALT: 14 U/L (ref 0–35)
AST: 18 U/L (ref 0–37)
Albumin: 2.9 g/dL — ABNORMAL LOW (ref 3.5–5.2)
Alkaline Phosphatase: 84 U/L (ref 39–117)
BUN: 11 mg/dL (ref 6–23)
CO2: 26 mEq/L (ref 19–32)
Calcium: 9.8 mg/dL (ref 8.4–10.5)
Chloride: 102 mEq/L (ref 96–112)
Creatinine, Ser: 0.95 mg/dL (ref 0.40–1.20)
GFR: 60.04 mL/min (ref >60.00)
Glucose, Bld: 95 mg/dL (ref 70–99)
Potassium: 4.2 mEq/L (ref 3.5–5.1)
Sodium: 140 mEq/L (ref 135–145)
Total Bilirubin: 0.8 mg/dL (ref 0.2–1.2)
Total Protein: 7.7 g/dL (ref 6.0–8.3)

## 2022-08-12 MED ORDER — CIPROFLOXACIN HCL 250 MG PO TABS: 250.0000 mg | ORAL_TABLET | Freq: Two times a day (BID) | ORAL | 0 refills | Status: AC

## 2022-08-12 NOTE — Progress Notes (Signed)
Subjective:    Patient ID: [name], female    DOB: 1951/03/03, 71 y.o.   MRN: [id number]  HPI  Pt in with diarrhea for one week. She states is not getting better. She states this am already 3-4 loose stools.  States at least 5 loose stools a day or more. No recent antbiotics. No suspicious foods prior to onset of signs/symptoms.  No nausea or vomiting.  If every gets loose stools is one day and self limiting.  No fever, no chills or sweats. No known contacts sick.    Review of Systems  Constitutional:  Negative for chills, fatigue and fever.  Respiratory:  Negative for cough, chest tightness, shortness of breath and wheezing.   Cardiovascular:  Negative for chest pain and palpitations.  Gastrointestinal:  Positive for diarrhea. Negative for abdominal pain, nausea and vomiting.  Musculoskeletal:  Negative for back pain and myalgias.  Skin:  Negative for rash.   No past medical history on file.   Social History   Socioeconomic History   Marital status: Widowed    Spouse name: Not on file   Number of children: Not on file   Years of education: Not on file   Highest education level: Not on file  Occupational History   Not on file  Tobacco Use   Smoking status: Never   Smokeless tobacco: Never  Substance and Sexual Activity   Alcohol use: No    Alcohol/week: 0.0 standard drinks of alcohol   Drug use: No   Sexual activity: Not on file  Other Topics Concern   Not on file  Social History Narrative   Not on file   Social Determinants of Health   Financial Resource Strain: Low Risk  (05/22/2021)   Overall Financial Resource Strain (CARDIA)    Difficulty of Paying Living Expenses: Not hard at all  Food Insecurity: No Food Insecurity (05/22/2021)   Hunger Vital Sign    Worried About Running Out of Food in the Last Year: Never true    [place] in the Last Year: Never true  Transportation Needs: No Transportation Needs (05/22/2021)   PRAPARE -  Hydrologist (Medical): No    Lack of Transportation (Non-Medical): No  Physical Activity: Inactive (05/22/2021)   Exercise Vital Sign    Days of Exercise per Week: 0 days    Minutes of Exercise per Session: 0 min  Stress: No Stress Concern Present (05/22/2021)   [place]    Feeling of Stress : Not at all  Social Connections: Moderately Isolated (05/22/2021)   Social Connection and Isolation Panel [NHANES]    Frequency of Communication with Friends and Family: More than three times a week    Frequency of Social Gatherings with Friends and Family: More than three times a week    Attends Religious Services: More than 4 times per year    Active Member of [employer] or Organizations: No    Attends Archivist Meetings: Never    Marital Status: Widowed  Intimate Partner Violence: Not At Risk (05/22/2021)   Humiliation, Afraid, Rape, and Kick questionnaire    Fear of Current or Ex-Partner: No    Emotionally Abused: No    Physically Abused: No    Sexually Abused: No    Past Surgical History:  Procedure Laterality Date   BLADDER SURGERY      Family History  Problem Relation Age of Onset  Healthy Mother    Healthy Father    Breast cancer Neg Hx     No Known Allergies  Current Outpatient Medications on File Prior to Visit  Medication Sig Dispense Refill   B Complex-C (SUPER B COMPLEX PO) Take 1 tablet by mouth daily.     Biotin 1000 MCG tablet Take 1,000 mcg by mouth daily.     calcium-vitamin D 250-100 MG-UNIT per tablet Take 1 tablet by mouth 2 (two) times daily.     Cinnamon 500 MG TABS Take 1 tablet by mouth daily.     Flaxseed, Linseed, (FLAXSEED OIL) 1200 MG CAPS Take 1 capsule by mouth daily.     Lactobacillus (ACIDOPHILUS PROBIOTIC PO) Take 1 tablet by mouth daily.     Magnesium 250 MG TABS Take 1 tablet by mouth daily.     Multiple Vitamins-Minerals (CENTRUM SILVER 50+WOMEN  PO) Take 1 tablet by mouth daily.     Specialty Vitamins Products (ONE-A-DAY BONE STRENGTH PO) Take 1 tablet by mouth daily.     UNABLE TO FIND Take 15 mg by mouth daily. Med Name: Life Extension DHEA     UNABLE TO FIND 2 (two) times daily. Med Name: Transitions menopause support     ALPRAZolam (XANAX) 0.5 MG tablet Take 0.5-1 tablets (0.25-0.5 mg total) by mouth at bedtime as needed for anxiety. 30 tablet 0   UNABLE TO FIND Med Name: Beta Yam 900 cream bid     No current facility-administered medications on file prior to visit.    BP (!) 160/80   Pulse 96   Temp 98 F (36.7 C)   Resp 18   Ht '5\' 6"'$  (1.676 m)   Wt 147 lb 12.8 oz (67 kg)   SpO2 99%   BMI 23.86 kg/m        Objective:   Physical Exam  General Mental Status- Alert. General Appearance- Not in acute distress.   Skin General: Color- Normal Color. Moisture- Normal Moisture.  Neck Carotid Arteries- Normal color. Moisture- Normal Moisture. No carotid bruits. No JVD.  Chest and Lung Exam Auscultation: Breath Sounds:-Normal.  Cardiovascular Auscultation:Rythm- Regular. Murmurs & Other Heart Sounds:Auscultation of the heart reveals- No Murmurs.  Abdomen Inspection:-Inspeection Normal. Palpation/Percussion:Note:No mass. Palpation and Percussion of the abdomen reveal- Non Tender, Non Distended + BS, no rebound or guarding.    Neurologic Cranial Nerve exam:- CN III-XII intact(No nystagmus), symmetric smile. Drift Test:- No drift. Romberg Exam:- Negative.  Heal to Toe Gait exam:-Normal. Finger to Nose:- Normal/Intact Strength:- 5/5 equal and symmetric strength both upper and lower extremities.       Assessment & Plan:  [phone]  Patient Instructions  Diarrhea viral cause vs bacterial. 7 days into symptoms and at least 5 loose stools a day. Think maybe bacterial at this point. Turn in stool test today. Continue bland diet and hydrate well. Can start imodium after stool studies. Start low dose cipro 250  mg for 3 days pending stool test results.  Cbc and cmp today.  Follow up in 7-10 days or sooner if needed.    Mackie Pai, PA-C

## 2022-08-12 NOTE — Patient Instructions (Signed)
Diarrhea viral cause vs bacterial. 7 days into symptoms and at least 5 loose stools a day. Think maybe bacterial at this point. Turn in stool test today. Continue bland diet and hydrate well. Can start imodium after stool studies. Start low dose cipro 250 mg for 3 days pending stool test results.  Cbc and cmp today.  Follow up in 7-10 days or sooner if needed.

## 2022-08-13 ENCOUNTER — Telehealth: Payer: Self-pay | Admitting: Family Medicine

## 2022-08-13 LAB — OVA AND PARASITE EXAMINATION
CONCENTRATE RESULT:: NONE SEEN
MICRO NUMBER:: 14340127
SPECIMEN QUALITY:: ADEQUATE
TRICHROME RESULT:: NONE SEEN

## 2022-08-13 NOTE — Telephone Encounter (Signed)
Pt is having issues with her mychart and would like pcp to call her once results are in.

## 2022-08-13 NOTE — Telephone Encounter (Signed)
Pt was seen by ES- she is referring to the labs that he most recently ordered, they have not been looked at just yet.

## 2022-08-13 NOTE — Telephone Encounter (Signed)
Pt aware and voices understanding.   

## 2022-08-14 LAB — CLOSTRIDIUM DIFFICILE BY PCR: Toxigenic C. Difficile by PCR: NEGATIVE

## 2022-08-16 LAB — STOOL CULTURE: E coli, Shiga toxin Assay: NEGATIVE

## 2022-10-20 DIAGNOSIS — L820 Inflamed seborrheic keratosis: Secondary | ICD-10-CM | POA: Diagnosis not present

## 2022-10-20 DIAGNOSIS — Z85828 Personal history of other malignant neoplasm of skin: Secondary | ICD-10-CM | POA: Diagnosis not present

## 2022-10-20 DIAGNOSIS — C44529 Squamous cell carcinoma of skin of other part of trunk: Secondary | ICD-10-CM | POA: Diagnosis not present

## 2022-10-20 DIAGNOSIS — L570 Actinic keratosis: Secondary | ICD-10-CM | POA: Diagnosis not present

## 2022-11-26 ENCOUNTER — Telehealth: Payer: Self-pay | Admitting: Family Medicine

## 2022-11-26 NOTE — Telephone Encounter (Signed)
Contacted [name] to schedule their annual wellness visit. Patient declined to schedule AWV at this time. Do not call 11/26/22  Sherol Dade; [facility] Direct Dial: [phone]

## 2022-12-15 NOTE — Patient Instructions (Addendum)
It was good to see again today I will be in touch with your labs soon as possible I recommend getting the Shingrix series at your pharmacy and a pneumonia booster  Please check your BP at home a few times over the next 2-3 weeks; we would like to see you under 140/90. If you are routinely higher than these numbers we should consider starting a BP medication for you (long term, untreated hypertension can lead to kidney disease, heart disease and stroke) Please let me know how your BP is looking at home!    We will send you a Cologuard kit at home

## 2022-12-15 NOTE — Progress Notes (Addendum)
[facility] [address] [place], [place] [address] [phone]  Date:  12/21/2022   Name:  [name]   DOB:  1951/03/21   MRN:  [id number]  PCP:  Pearline Cables, MD    Chief Complaint: Annual Exam (Concerns/ questions: none/Shingrix: none/Colon: none since last visit/Pna: none)   History of Present Illness:  [name] is a 72 y.o. very pleasant female patient who presents with the following:  Patient seen today for physical exam.  Patient with previous history of liver abscess and sepsis in 2016, osteopenia and skin cancer Most recent visit with myself was about 2 years ago, April 2022 She did see my partner Esperanza Richters this past 08-26-23 with diarrhea   She is followed by dermatology, Dr. Doreen Beam for skin cancer-most recent visit in 2022/10/26 Her husband died in January 02, 2022from complications of COVID-19  There is some lab work on chart from Jan 02, 2024CMP, CBC, some stool testing She is fasting today   Mammogram up-to-date Bone density 10/22-reminded patient we can update her bone density this fall Colon cancer screening appears to be due- she notes Deboraha Sprang has contacted her.  She is aware that this is due but is not certain if she wishes to be screened.  I encouraged her to discuss this with her gastroenterologist Shingrix not done-  declines  COVID never done- declines  Needs pneumonia booster-she so far had a Prevnar 13-declines booster  She enjoys walking for exercise, spends a lot of time outdoors  No CP or SOB She does not drink or use tobacco No PMB   BP Readings from Last 3 Encounters:  12/21/22 (!) 148/90  08/12/22 (!) 160/80  12/09/20 124/82    Patient Active Problem List   Diagnosis Date Noted   Prediabetes 12/31/2019   Skin cancer 06/01/2018   Grief at loss of child 05/24/2017   Osteopenia 03/10/2017   Liver abscess 09/13/2014   Lesion of liver 09/13/2014    Sepsis (HCC) 09/13/2014    No past medical history on file.  Past Surgical History:  Procedure Laterality Date   BLADDER SURGERY      Social History   Tobacco Use   Smoking status: Never   Smokeless tobacco: Never  Substance Use Topics   Alcohol use: No    Alcohol/week: 0.0 standard drinks of alcohol   Drug use: No    Family History  Problem Relation Age of Onset   Healthy Mother    Healthy Father    Breast cancer Neg Hx     No Known Allergies  Medication list has been reviewed and updated.  Current Outpatient Medications on File Prior to Visit  Medication Sig Dispense Refill   B Complex-C (SUPER B COMPLEX PO) Take 1 tablet by mouth daily.     Biotin 1000 MCG tablet Take 1,000 mcg by mouth daily.     calcium-vitamin D 250-100 MG-UNIT per tablet Take 1 tablet by mouth 2 (two) times daily.     Cinnamon 500 MG TABS Take 1 tablet by mouth daily.     Flaxseed, Linseed, (FLAXSEED OIL) 1200 MG CAPS Take 1 capsule by mouth daily.     Lactobacillus (ACIDOPHILUS PROBIOTIC PO) Take 1 tablet by mouth daily.     Magnesium 250 MG TABS Take 1 tablet by mouth daily.     Multiple Vitamins-Minerals (CENTRUM SILVER 50+WOMEN PO) Take 1 tablet by mouth daily.  No current facility-administered medications on file prior to visit.    Review of Systems:  As per HPI- otherwise negative.   Physical Examination: Vitals:   12/21/22 0814  BP: (!) 148/90  Pulse: 84  Resp: 18  Temp: 97.8 F (36.6 C)  SpO2: 99%   Vitals:   12/21/22 0814  Weight: 152 lb 6.4 oz (69.1 kg)  Height: 5\' 6"  (1.676 m)   Body mass index is 24.6 kg/m. Ideal Body Weight: Weight in (lb) to have BMI = 25: 154.6  GEN: no acute distress.  Normal weight, looks well HEENT: Atraumatic, Normocephalic.  Bilateral TM wnl, oropharynx normal.  PEERL,EOMI.   Ears and Nose: No external deformity. CV: RRR, No M/G/R. No JVD. No thrill. No extra heart sounds. PULM: CTA B, no wheezes, crackles, rhonchi. No  retractions. No resp. distress. No accessory muscle use. ABD: S, NT, ND, +BS. No rebound. No HSM. EXTR: No c/c/e PSYCH: Normally interactive. Conversant.    Assessment and Plan: Physical exam  Screening for diabetes mellitus - Plan: Comprehensive metabolic panel, Hemoglobin A1c  Thyroid disorder screening - Plan: TSH  Screening for deficiency anemia - Plan: CBC  Screening, lipid - Plan: Lipid panel  Fatigue, unspecified type - Plan: VITAMIN D 25 Hydroxy (Vit-D Deficiency, Fractures)  Poisoning by vitamin D, accidental or unintentional, subsequent encounter - Plan: VITAMIN D 25 Hydroxy (Vit-D Deficiency, Fractures)  Screening for colon cancer - Plan: Cologuard  Elevated blood pressure reading  Physical exam today.  Encouraged healthy diet and exercise routine Went over recommended immunizations and other screening exams Will plan further follow- up pending labs. Blood pressure is elevated again today.  I offered to start her medication, she declines.  She does have a home blood pressure cuff, I asked her to please gather some home blood pressure readings and give me an update in 2 or 3 weeks.  She states understanding and agreement She does not have access to her MyChart account so I turned it off  Signed Abbe Amsterdam, MD  Received labs as below, letter to patient Results for orders placed or performed in visit on 12/21/22  CBC  Result Value Ref Range   WBC 6.3 4.0 - 10.5 K/uL   RBC 5.02 3.87 - 5.11 Mil/uL   Platelets 177.0 150.0 - 400.0 K/uL   Hemoglobin 15.0 12.0 - 15.0 g/dL   HCT 16.1 09.6 - 04.5 %   MCV 88.9 78.0 - 100.0 fl   MCHC 33.5 30.0 - 36.0 g/dL   RDW 40.9 81.1 - 91.4 %  Comprehensive metabolic panel  Result Value Ref Range   Sodium 140 135 - 145 mEq/L   Potassium 4.4 3.5 - 5.1 mEq/L   Chloride 102 96 - 112 mEq/L   CO2 29 19 - 32 mEq/L   Glucose, Bld 98 70 - 99 mg/dL   BUN 14 6 - 23 mg/dL   Creatinine, Ser 7.82 0.40 - 1.20 mg/dL   Total Bilirubin 1.2  0.2 - 1.2 mg/dL   Alkaline Phosphatase 74 39 - 117 U/L   AST 21 0 - 37 U/L   ALT 15 0 - 35 U/L   Total Protein 7.0 6.0 - 8.3 g/dL   Albumin 4.3 3.5 - 5.2 g/dL   GFR 95.62 (L) >13.08 mL/min   Calcium 9.5 8.4 - 10.5 mg/dL  Hemoglobin M5H  Result Value Ref Range   Hgb A1c MFr Bld 5.7 4.6 - 6.5 %  Lipid panel  Result Value Ref Range   Cholesterol  193 0 - 200 mg/dL   Triglycerides 16.1 0.0 - 149.0 mg/dL   HDL 09.60 >45.40 mg/dL   VLDL 98.1 0.0 - 19.1 mg/dL   LDL Cholesterol 478 (H) 0 - 99 mg/dL   Total CHOL/HDL Ratio 3    NonHDL 129.53   TSH  Result Value Ref Range   TSH 1.97 0.35 - 5.50 uIU/mL  VITAMIN D 25 Hydroxy (Vit-D Deficiency, Fractures)  Result Value Ref Range   VITD 85.15 30.00 - 100.00 ng/mL

## 2022-12-21 ENCOUNTER — Ambulatory Visit (INDEPENDENT_AMBULATORY_CARE_PROVIDER_SITE_OTHER): Payer: Medicare HMO | Admitting: Family Medicine

## 2022-12-21 VITALS — BP 148/90 | HR 84 | Temp 97.8°F | Resp 18 | Ht 66.0 in | Wt 152.4 lb

## 2022-12-21 DIAGNOSIS — R5383 Other fatigue: Secondary | ICD-10-CM

## 2022-12-21 DIAGNOSIS — Z130 Encounter for screening for diseases of the blood and blood-forming organs and certain disorders involving the immune mechanism: Secondary | ICD-10-CM | POA: Diagnosis not present

## 2022-12-21 DIAGNOSIS — T452X1D Poisoning by vitamins, accidental (unintentional), subsequent encounter: Secondary | ICD-10-CM | POA: Diagnosis not present

## 2022-12-21 DIAGNOSIS — Z1329 Encounter for screening for other suspected endocrine disorder: Secondary | ICD-10-CM | POA: Diagnosis not present

## 2022-12-21 DIAGNOSIS — R030 Elevated blood-pressure reading, without diagnosis of hypertension: Secondary | ICD-10-CM

## 2022-12-21 DIAGNOSIS — Z0000 Encounter for general adult medical examination without abnormal findings: Secondary | ICD-10-CM | POA: Diagnosis not present

## 2022-12-21 DIAGNOSIS — Z13220 Encounter for screening for lipoid disorders: Secondary | ICD-10-CM | POA: Diagnosis not present

## 2022-12-21 DIAGNOSIS — Z1211 Encounter for screening for malignant neoplasm of colon: Secondary | ICD-10-CM

## 2022-12-21 DIAGNOSIS — R944 Abnormal results of kidney function studies: Secondary | ICD-10-CM

## 2022-12-21 DIAGNOSIS — Z131 Encounter for screening for diabetes mellitus: Secondary | ICD-10-CM

## 2022-12-21 LAB — COMPREHENSIVE METABOLIC PANEL
ALT: 15 U/L (ref 0–35)
AST: 21 U/L (ref 0–37)
Albumin: 4.3 g/dL (ref 3.5–5.2)
Alkaline Phosphatase: 74 U/L (ref 39–117)
BUN: 14 mg/dL (ref 6–23)
CO2: 29 mEq/L (ref 19–32)
Calcium: 9.5 mg/dL (ref 8.4–10.5)
Chloride: 102 mEq/L (ref 96–112)
Creatinine, Ser: 0.98 mg/dL (ref 0.40–1.20)
GFR: 57.7 mL/min — ABNORMAL LOW (ref >60.00)
Glucose, Bld: 98 mg/dL (ref 70–99)
Potassium: 4.4 mEq/L (ref 3.5–5.1)
Sodium: 140 mEq/L (ref 135–145)
Total Bilirubin: 1.2 mg/dL (ref 0.2–1.2)
Total Protein: 7 g/dL (ref 6.0–8.3)

## 2022-12-21 LAB — LIPID PANEL
Cholesterol: 193 mg/dL (ref 0–200)
HDL: 63 mg/dL (ref >39.00)
LDL Cholesterol: 114 mg/dL — ABNORMAL HIGH (ref 0–99)
NonHDL: 129.53
Total CHOL/HDL Ratio: 3
Triglycerides: 76 mg/dL (ref 0.0–149.0)
VLDL: 15.2 mg/dL (ref 0.0–40.0)

## 2022-12-21 LAB — CBC
HCT: 44.6 % (ref 36.0–46.0)
Hemoglobin: 15 g/dL (ref 12.0–15.0)
MCHC: 33.5 g/dL (ref 30.0–36.0)
MCV: 88.9 fl (ref 78.0–100.0)
Platelets: 177 10*3/uL (ref 150.0–400.0)
RBC: 5.02 Mil/uL (ref 3.87–5.11)
RDW: 13.3 % (ref 11.5–15.5)
WBC: 6.3 10*3/uL (ref 4.0–10.5)

## 2022-12-21 LAB — TSH: TSH: 1.97 u[IU]/mL (ref 0.35–5.50)

## 2022-12-21 LAB — VITAMIN D 25 HYDROXY (VIT D DEFICIENCY, FRACTURES): VITD: 85.15 ng/mL (ref 30.00–100.00)

## 2022-12-21 LAB — HEMOGLOBIN A1C: Hgb A1c MFr Bld: 5.7 % (ref 4.6–6.5)

## 2022-12-21 NOTE — Addendum Note (Signed)
Addended by: Pearline Cables on: 12/21/2022 03:37 PM   Modules accepted: Orders

## 2022-12-28 DIAGNOSIS — Z1211 Encounter for screening for malignant neoplasm of colon: Secondary | ICD-10-CM | POA: Diagnosis not present

## 2023-01-04 ENCOUNTER — Encounter: Payer: Self-pay | Admitting: Family Medicine

## 2023-01-04 LAB — COLOGUARD: COLOGUARD: NEGATIVE

## 2023-01-26 DIAGNOSIS — H43813 Vitreous degeneration, bilateral: Secondary | ICD-10-CM | POA: Diagnosis not present

## 2023-01-26 DIAGNOSIS — H2513 Age-related nuclear cataract, bilateral: Secondary | ICD-10-CM | POA: Diagnosis not present

## 2023-01-26 DIAGNOSIS — H5203 Hypermetropia, bilateral: Secondary | ICD-10-CM | POA: Diagnosis not present

## 2023-01-26 DIAGNOSIS — Z135 Encounter for screening for eye and ear disorders: Secondary | ICD-10-CM | POA: Diagnosis not present

## 2023-02-22 ENCOUNTER — Other Ambulatory Visit: Payer: Self-pay | Admitting: Family Medicine

## 2023-02-22 DIAGNOSIS — Z0000 Encounter for general adult medical examination without abnormal findings: Secondary | ICD-10-CM

## 2023-04-05 ENCOUNTER — Ambulatory Visit
Admission: RE | Admit: 2023-04-05 | Discharge: 2023-04-05 | Disposition: A | Discharge status: Home / Self Care | Payer: Medicare HMO | Source: Ambulatory Visit | Attending: Family Medicine | Admitting: Family Medicine

## 2023-04-05 DIAGNOSIS — Z1231 Encounter for screening mammogram for malignant neoplasm of breast: Secondary | ICD-10-CM | POA: Diagnosis not present

## 2023-04-05 DIAGNOSIS — Z0000 Encounter for general adult medical examination without abnormal findings: Secondary | ICD-10-CM

## 2023-05-06 DIAGNOSIS — L570 Actinic keratosis: Secondary | ICD-10-CM | POA: Diagnosis not present

## 2023-05-06 DIAGNOSIS — L814 Other melanin hyperpigmentation: Secondary | ICD-10-CM | POA: Diagnosis not present

## 2023-05-06 DIAGNOSIS — D692 Other nonthrombocytopenic purpura: Secondary | ICD-10-CM | POA: Diagnosis not present

## 2023-05-06 DIAGNOSIS — L821 Other seborrheic keratosis: Secondary | ICD-10-CM | POA: Diagnosis not present

## 2023-05-06 DIAGNOSIS — Z85828 Personal history of other malignant neoplasm of skin: Secondary | ICD-10-CM | POA: Diagnosis not present

## 2023-09-29 ENCOUNTER — Other Ambulatory Visit: Payer: Self-pay | Admitting: Family Medicine

## 2023-09-29 DIAGNOSIS — Z1231 Encounter for screening mammogram for malignant neoplasm of breast: Secondary | ICD-10-CM

## 2023-11-04 DIAGNOSIS — L821 Other seborrheic keratosis: Secondary | ICD-10-CM | POA: Diagnosis not present

## 2023-11-04 DIAGNOSIS — D485 Neoplasm of uncertain behavior of skin: Secondary | ICD-10-CM | POA: Diagnosis not present

## 2023-11-04 DIAGNOSIS — L570 Actinic keratosis: Secondary | ICD-10-CM | POA: Diagnosis not present

## 2023-11-04 DIAGNOSIS — L820 Inflamed seborrheic keratosis: Secondary | ICD-10-CM | POA: Diagnosis not present

## 2023-11-04 DIAGNOSIS — L814 Other melanin hyperpigmentation: Secondary | ICD-10-CM | POA: Diagnosis not present

## 2023-11-04 DIAGNOSIS — D1801 Hemangioma of skin and subcutaneous tissue: Secondary | ICD-10-CM | POA: Diagnosis not present

## 2023-11-04 DIAGNOSIS — Z85828 Personal history of other malignant neoplasm of skin: Secondary | ICD-10-CM | POA: Diagnosis not present

## 2023-12-17 NOTE — Patient Instructions (Addendum)
 It was good to see you today I will be in touch with your labs Recommend age appropriate immunizations  Let me know if you would like to have an ultrasound of the bump over your collarbone

## 2023-12-17 NOTE — Progress Notes (Signed)
 [facility] [address] [place], [place] [address] [phone]  Date:  12/22/2023   Name:  [name]   DOB:  [birth year]   MRN:  [id number]  PCP:  Kaylee Partridge, MD    Chief Complaint: Annual Exam   History of Present Illness:  [name] is a 73 y.o. very pleasant female patient who presents with the following:  Pt seen today for CPE Last seen by myself about one year ago  Patient with previous history of liver abscess and sepsis in 01-02-15, osteopenia and skin cancer At her last visit she was overall doing ok, noted elevated BP.  She declined treatment for her BP   Husband died in 01/02/2020 Has typically declined immunizations - same today   Enjoys walking for exercise - no CP or SOB noted  No alcohol or tobacco  Colon cancer screening- colon 01/02/15, 5 year recheck was requested - she did a colguard instead last year per her preferance  Mammo UTD Can offer dexa Immunizations:   She notes a non- tender "knot" on her right shoulder- has been there for about 2 weeks She sees derm for her skin check   Patient Active Problem List   Diagnosis Date Noted   Prediabetes 12/31/2019   Skin cancer 06/01/2018   Grief at loss of child 05/24/2017   Osteopenia 03/10/2017   Liver abscess 09/13/2014   Lesion of liver 09/13/2014   Sepsis (HCC) 09/13/2014    No past medical history on file.  Past Surgical History:  Procedure Laterality Date   BLADDER SURGERY      Social History   Tobacco Use   Smoking status: Never   Smokeless tobacco: Never  Substance Use Topics   Alcohol use: No    Alcohol/week: 0.0 standard drinks of alcohol   Drug use: No    Family History  Problem Relation Age of Onset   Healthy Mother    Healthy Father    Breast cancer Neg Hx     No Known Allergies  Medication list has been reviewed and updated.  Current Outpatient Medications on File Prior to Visit  Medication Sig  Dispense Refill   B Complex-C (SUPER B COMPLEX PO) Take 1 tablet by mouth daily.     Biotin 1000 MCG tablet Take 1,000 mcg by mouth daily.     calcium-vitamin D  250-100 MG-UNIT per tablet Take 1 tablet by mouth 2 (two) times daily.     Cinnamon 500 MG TABS Take 1 tablet by mouth daily.     Flaxseed, Linseed, (FLAXSEED OIL) 1200 MG CAPS Take 1 capsule by mouth daily.     Lactobacillus (ACIDOPHILUS PROBIOTIC PO) Take 1 tablet by mouth daily.     Magnesium 250 MG TABS Take 1 tablet by mouth daily.     Multiple Vitamins-Minerals (CENTRUM SILVER 50+WOMEN PO) Take 1 tablet by mouth daily.     No current facility-administered medications on file prior to visit.    Review of Systems:  As per HPI- otherwise negative.   Physical Examination: Vitals:   12/22/23 0805  BP: 138/82  Pulse: 83  Temp: 98.2 F (36.8 C)  SpO2: 99%   Vitals:   12/22/23 0805  Weight: 152 lb 6.4 oz (69.1 kg)  Height: 5\' 6"  (1.676 m)   Body mass index is 24.6 kg/m. Ideal Body Weight: Weight in (lb) to have BMI = 25: 154.6  GEN: no acute  distress. Normal weight, looks well  HEENT: Atraumatic, Normocephalic.  Bilateral TM wnl, oropharynx normal.  PEERL,EOMI.   Ears and Nose: No external deformity. CV: RRR, No M/G/R. No JVD. No thrill. No extra heart sounds. PULM: CTA B, no wheezes, crackles, rhonchi. No retractions. No resp. distress. No accessory muscle use. ABD: S, NT, ND, +BS. No rebound. No HSM. EXTR: No c/c/e PSYCH: Normally interactive. Conversant.  Appox 1cm mobile mass- likely cyst- over the right distal clavicle  Non- tender, normal ROM of the shoulder  Assessment and Plan: Physical exam  Screening for diabetes mellitus  Thyroid  disorder screening  Screening, lipid  Screening for deficiency anemia  Physical exam today- encouraged healthy diet and exercise routine Will plan further follow- up pending labs. Mammo is scheduled for later this summer She declines all immunizations  Offered to  get an US  of the likely cyst on her shoulder- she declines for now but will let me know if this changes   Signed Gates Kasal, MD  Received labs, letter to patient  Results for orders placed or performed in visit on 12/22/23  CBC   Collection Time: 12/22/23  8:28 AM  Result Value Ref Range   WBC 7.0 4.0 - 10.5 K/uL   RBC 4.87 3.87 - 5.11 Mil/uL   Platelets 177.0 150.0 - 400.0 K/uL   Hemoglobin 14.3 12.0 - 15.0 g/dL   HCT 16.1 09.6 - 04.5 %   MCV 89.9 78.0 - 100.0 fl   MCHC 32.5 30.0 - 36.0 g/dL   RDW 40.9 81.1 - 91.4 %  Comprehensive metabolic panel with GFR   Collection Time: 12/22/23  8:28 AM  Result Value Ref Range   Sodium 139 135 - 145 mEq/L   Potassium 4.5 3.5 - 5.1 mEq/L   Chloride 101 96 - 112 mEq/L   CO2 31 19 - 32 mEq/L   Glucose, Bld 102 (H) 70 - 99 mg/dL   BUN 19 6 - 23 mg/dL   Creatinine, Ser 7.82 0.40 - 1.20 mg/dL   Total Bilirubin 1.3 (H) 0.2 - 1.2 mg/dL   Alkaline Phosphatase 78 39 - 117 U/L   AST 22 0 - 37 U/L   ALT 15 0 - 35 U/L   Total Protein 6.9 6.0 - 8.3 g/dL   Albumin 4.3 3.5 - 5.2 g/dL   GFR 95.62 >13.08 mL/min   Calcium 9.4 8.4 - 10.5 mg/dL  Hemoglobin M5H   Collection Time: 12/22/23  8:28 AM  Result Value Ref Range   Hgb A1c MFr Bld 5.7 4.6 - 6.5 %  Lipid panel   Collection Time: 12/22/23  8:28 AM  Result Value Ref Range   Cholesterol 196 0 - 200 mg/dL   Triglycerides 846.9 0.0 - 149.0 mg/dL   HDL 62.95 >28.41 mg/dL   VLDL 32.4 0.0 - 40.1 mg/dL   LDL Cholesterol 027 (H) 0 - 99 mg/dL   Total CHOL/HDL Ratio 3    NonHDL 139.58   TSH   Collection Time: 12/22/23  8:28 AM  Result Value Ref Range   TSH 2.19 0.35 - 5.50 uIU/mL

## 2023-12-22 ENCOUNTER — Ambulatory Visit (INDEPENDENT_AMBULATORY_CARE_PROVIDER_SITE_OTHER): Payer: Medicare HMO | Admitting: Family Medicine

## 2023-12-22 ENCOUNTER — Encounter: Payer: Self-pay | Admitting: Family Medicine

## 2023-12-22 VITALS — BP 138/82 | HR 83 | Temp 98.2°F | Ht 66.0 in | Wt 152.4 lb

## 2023-12-22 DIAGNOSIS — Z131 Encounter for screening for diabetes mellitus: Secondary | ICD-10-CM

## 2023-12-22 DIAGNOSIS — Z1329 Encounter for screening for other suspected endocrine disorder: Secondary | ICD-10-CM | POA: Diagnosis not present

## 2023-12-22 DIAGNOSIS — Z0000 Encounter for general adult medical examination without abnormal findings: Secondary | ICD-10-CM | POA: Diagnosis not present

## 2023-12-22 DIAGNOSIS — Z130 Encounter for screening for diseases of the blood and blood-forming organs and certain disorders involving the immune mechanism: Secondary | ICD-10-CM | POA: Diagnosis not present

## 2023-12-22 DIAGNOSIS — R17 Unspecified jaundice: Secondary | ICD-10-CM

## 2023-12-22 DIAGNOSIS — Z13220 Encounter for screening for lipoid disorders: Secondary | ICD-10-CM

## 2023-12-22 LAB — COMPREHENSIVE METABOLIC PANEL WITH GFR
ALT: 15 U/L (ref 0–35)
AST: 22 U/L (ref 0–37)
Albumin: 4.3 g/dL (ref 3.5–5.2)
Alkaline Phosphatase: 78 U/L (ref 39–117)
BUN: 19 mg/dL (ref 6–23)
CO2: 31 meq/L (ref 19–32)
Calcium: 9.4 mg/dL (ref 8.4–10.5)
Chloride: 101 meq/L (ref 96–112)
Creatinine, Ser: 0.91 mg/dL (ref 0.40–1.20)
GFR: 62.62 mL/min (ref >60.00)
Glucose, Bld: 102 mg/dL — ABNORMAL HIGH (ref 70–99)
Potassium: 4.5 meq/L (ref 3.5–5.1)
Sodium: 139 meq/L (ref 135–145)
Total Bilirubin: 1.3 mg/dL — ABNORMAL HIGH (ref 0.2–1.2)
Total Protein: 6.9 g/dL (ref 6.0–8.3)

## 2023-12-22 LAB — LIPID PANEL
Cholesterol: 196 mg/dL (ref 0–200)
HDL: 56.9 mg/dL (ref >39.00)
LDL Cholesterol: 116 mg/dL — ABNORMAL HIGH (ref 0–99)
NonHDL: 139.58
Total CHOL/HDL Ratio: 3
Triglycerides: 119 mg/dL (ref 0.0–149.0)
VLDL: 23.8 mg/dL (ref 0.0–40.0)

## 2023-12-22 LAB — CBC
HCT: 43.8 % (ref 36.0–46.0)
Hemoglobin: 14.3 g/dL (ref 12.0–15.0)
MCHC: 32.5 g/dL (ref 30.0–36.0)
MCV: 89.9 fl (ref 78.0–100.0)
Platelets: 177 10*3/uL (ref 150.0–400.0)
RBC: 4.87 Mil/uL (ref 3.87–5.11)
RDW: 13.1 % (ref 11.5–15.5)
WBC: 7 10*3/uL (ref 4.0–10.5)

## 2023-12-22 LAB — HEMOGLOBIN A1C: Hgb A1c MFr Bld: 5.7 % (ref 4.6–6.5)

## 2023-12-22 LAB — TSH: TSH: 2.19 u[IU]/mL (ref 0.35–5.50)

## 2023-12-30 DIAGNOSIS — Z85828 Personal history of other malignant neoplasm of skin: Secondary | ICD-10-CM | POA: Diagnosis not present

## 2023-12-30 DIAGNOSIS — L820 Inflamed seborrheic keratosis: Secondary | ICD-10-CM | POA: Diagnosis not present

## 2023-12-30 DIAGNOSIS — L570 Actinic keratosis: Secondary | ICD-10-CM | POA: Diagnosis not present

## 2024-01-27 DIAGNOSIS — H5203 Hypermetropia, bilateral: Secondary | ICD-10-CM | POA: Diagnosis not present

## 2024-01-27 DIAGNOSIS — Z135 Encounter for screening for eye and ear disorders: Secondary | ICD-10-CM | POA: Diagnosis not present

## 2024-01-27 DIAGNOSIS — H2513 Age-related nuclear cataract, bilateral: Secondary | ICD-10-CM | POA: Diagnosis not present

## 2024-01-27 DIAGNOSIS — H43813 Vitreous degeneration, bilateral: Secondary | ICD-10-CM | POA: Diagnosis not present

## 2024-04-05 ENCOUNTER — Ambulatory Visit
Admission: RE | Admit: 2024-04-05 | Discharge: 2024-04-05 | Disposition: A | Discharge status: Home / Self Care | Payer: Medicare HMO | Source: Ambulatory Visit | Attending: Family Medicine | Admitting: Family Medicine

## 2024-04-05 DIAGNOSIS — Z1231 Encounter for screening mammogram for malignant neoplasm of breast: Secondary | ICD-10-CM | POA: Diagnosis not present

## 2024-04-07 ENCOUNTER — Other Ambulatory Visit: Payer: Self-pay | Admitting: Family Medicine

## 2024-04-07 DIAGNOSIS — R928 Other abnormal and inconclusive findings on diagnostic imaging of breast: Secondary | ICD-10-CM

## 2024-04-13 ENCOUNTER — Other Ambulatory Visit: Payer: Self-pay | Admitting: Family Medicine

## 2024-04-13 ENCOUNTER — Ambulatory Visit
Admission: RE | Admit: 2024-04-13 | Discharge: 2024-04-13 | Disposition: A | Discharge status: Home / Self Care | Source: Ambulatory Visit | Attending: Family Medicine | Admitting: Family Medicine

## 2024-04-13 DIAGNOSIS — R921 Mammographic calcification found on diagnostic imaging of breast: Secondary | ICD-10-CM | POA: Diagnosis not present

## 2024-04-13 DIAGNOSIS — R928 Other abnormal and inconclusive findings on diagnostic imaging of breast: Secondary | ICD-10-CM

## 2024-04-19 ENCOUNTER — Encounter

## 2024-04-20 ENCOUNTER — Encounter

## 2024-05-08 DIAGNOSIS — D1801 Hemangioma of skin and subcutaneous tissue: Secondary | ICD-10-CM | POA: Diagnosis not present

## 2024-05-08 DIAGNOSIS — L570 Actinic keratosis: Secondary | ICD-10-CM | POA: Diagnosis not present

## 2024-05-08 DIAGNOSIS — L820 Inflamed seborrheic keratosis: Secondary | ICD-10-CM | POA: Diagnosis not present

## 2024-05-08 DIAGNOSIS — L814 Other melanin hyperpigmentation: Secondary | ICD-10-CM | POA: Diagnosis not present

## 2024-05-08 DIAGNOSIS — D692 Other nonthrombocytopenic purpura: Secondary | ICD-10-CM | POA: Diagnosis not present

## 2024-05-08 DIAGNOSIS — Z85828 Personal history of other malignant neoplasm of skin: Secondary | ICD-10-CM | POA: Diagnosis not present

## 2024-05-08 DIAGNOSIS — L821 Other seborrheic keratosis: Secondary | ICD-10-CM | POA: Diagnosis not present

## 2024-06-30 ENCOUNTER — Telehealth: Payer: Self-pay | Admitting: Family Medicine

## 2024-06-30 NOTE — Telephone Encounter (Signed)
 Copied from CRM #[id number]. Topic: Medicare AWV >> Jun 30, 2024  9:22 AM Nathanel DEL wrote: Reason for CRM: Called LVM 06/30/2024 to schedule AWV. Please schedule office or virtual visits  Nathanel Paschal; Care Guide Ambulatory Clinical Support [place] [facility] Direct Dial: [phone]

## 2024-12-25 ENCOUNTER — Encounter: Admitting: Family Medicine
